# Patient Record
Sex: Female | Born: 1990 | Race: White | Hispanic: No | Marital: Single | Smoking: Former smoker
Health system: Southern US, Community
[De-identification: ages and names within clinical notes are randomized; demographics above are authoritative.]

## PROBLEM LIST (undated history)

## (undated) ENCOUNTER — Inpatient Hospital Stay: Payer: Self-pay

## (undated) HISTORY — PX: TONSILLECTOMY: SUR1361

---

## 2010-06-03 ENCOUNTER — Emergency Department: Payer: Self-pay | Admitting: Emergency Medicine

## 2010-12-19 ENCOUNTER — Emergency Department: Payer: Self-pay | Admitting: Emergency Medicine

## 2013-01-06 ENCOUNTER — Ambulatory Visit: Payer: Self-pay | Admitting: Family Medicine

## 2013-03-27 ENCOUNTER — Observation Stay: Payer: Self-pay | Admitting: Obstetrics and Gynecology

## 2013-03-27 LAB — URINALYSIS, COMPLETE
Bilirubin,UR: NEGATIVE
Blood: NEGATIVE
Protein: NEGATIVE
RBC,UR: <1 /HPF (ref 0–5)
WBC UR: 8 /HPF (ref 0–5)

## 2013-03-27 LAB — GC/CHLAMYDIA PROBE AMP

## 2013-05-19 ENCOUNTER — Observation Stay: Payer: Self-pay | Admitting: Obstetrics and Gynecology

## 2013-05-19 LAB — DRUG SCREEN, URINE
Amphetamines, Ur Screen: NEGATIVE (ref ?–1000)
Benzodiazepine, Ur Scrn: NEGATIVE (ref ?–200)
MDMA (Ecstasy)Ur Screen: NEGATIVE (ref ?–500)
Methadone, Ur Screen: NEGATIVE (ref ?–300)
Opiate, Ur Screen: NEGATIVE (ref ?–300)
Phencyclidine (PCP) Ur S: NEGATIVE (ref ?–25)
Tricyclic, Ur Screen: NEGATIVE (ref ?–1000)

## 2013-05-20 ENCOUNTER — Inpatient Hospital Stay: Payer: Self-pay | Admitting: Obstetrics and Gynecology

## 2013-05-20 LAB — CBC WITH DIFFERENTIAL/PLATELET
Basophil %: 0.4 %
Eosinophil #: 0.2 10*3/uL (ref 0.0–0.7)
Eosinophil %: 1.2 %
HGB: 11.8 g/dL — ABNORMAL LOW (ref 12.0–16.0)
Lymphocyte #: 3.3 10*3/uL (ref 1.0–3.6)
MCH: 32.3 pg (ref 26.0–34.0)
MCHC: 34.7 g/dL (ref 32.0–36.0)
Monocyte #: 1.1 x10 3/mm — ABNORMAL HIGH (ref 0.2–0.9)
Monocyte %: 8.3 %
Neutrophil #: 8.4 10*3/uL — ABNORMAL HIGH (ref 1.4–6.5)
Neutrophil %: 64.7 %
Platelet: 207 10*3/uL (ref 150–440)

## 2013-05-22 LAB — GC/CHLAMYDIA PROBE AMP

## 2014-09-17 ENCOUNTER — Emergency Department: Payer: Self-pay | Admitting: Internal Medicine

## 2015-01-30 NOTE — H&P (Signed)
L&D Evaluation:  History Expanded:  HPI 24 yo G3P2002 at 32 weeks whohas been having cramping today and cam et o the ER. she has had a reasonably noneventful pregnancy with GC early in the pregnancy with TOC neg, late onset PNC, O pos RI, VZI,   Gravida 3   Term 2   PreTerm 0   Abortion 0   Living 2   Blood Type (Maternal) O positive   Group B Strep Results Maternal (Result >5wks must be treated as unknown) unknown/result > 5 weeks ago   Maternal HIV Negative   Maternal Syphilis Ab Nonreactive   Maternal Varicella Immune   Rubella Results (Maternal) immune   Maternal T-Dap Unknown   Presents with abdominal pain, contractions   Patient's Medical History No Chronic Illness   Patient's Surgical History none   Medications Pre Natal Vitamins   Allergies NKDA   Social History none   Family History Non-Contributory   ROS:  General normal   HEENT normal   CNS normal   GI normal   GU some pain with contractions   Resp normal   CV normal   Renal normal   MS normal   Exam:  Vital Signs stable   Urine Protein trace   General no apparent distress   Mental Status clear   Chest clear   Heart normal sinus rhythm   Abdomen other, gravid and tender to lower abd palpation,   Back no CVAT   Edema no edema   Reflexes 1+   Pelvic no external lesions   Mebranes Intact   FHT normal rate with no decels   FHT Description Late decelerations, 140   Ucx absent   Skin dry   Impression:  Impression UTI   Plan:  Plan UA, monitor contractions and for cervical change   Follow Up Appointment in 1 week   Electronic Signatures: Erik Obey (MD)  (Signed 06-Jul-14 21:20)  Authored: L&D Evaluation   Last Updated: 06-Jul-14 21:20 by Erik Obey (MD)

## 2015-01-30 NOTE — H&P (Signed)
L&D Evaluation:  History Expanded:  HPI 24 yo G3 P2002 at 21 w 5d who presents ion ctx, she does not have custody of her 24 yo and she is also late [facility] with dating by 51 week [place], she has been POS for marijuana this prefgnancy, has a hx of PP depression, and got tDAP on 03/01/13, she was POS for [facility] on 10/13/12, she was seen earlier asnd given therapeutic rest and now returns 5 hours later contracting and 4 cm/posterior/intact. POS for marijuana on admission this evening,   Gravida 3   Term 2   PreTerm 0   Abortion 0   Living 2   Blood Type (Maternal) O positive   Group B Strep Results Maternal (Result >5wks must be treated as unknown) negative   Maternal HIV Negative   Maternal Syphilis Ab Nonreactive   Maternal Varicella Immune   Rubella Results (Maternal) immune   Maternal T-Dap Immune   [facility] 22-May-2013   Presents with contractions, at 2-3 minutes for 4 hours   Patient's Medical History late onset PnC   Patient's Surgical History none   Medications Pre Natal Vitamins   Allergies NKDA   Social History none   Family History Non-Contributory   Current Prenatal Course Notable For unsure dating by 20 week ultrasound.   ROS:  ROS All systems were reviewed.  HEENT, CNS, GI, GU, Respiratory, CV, Renal and Musculoskeletal systems were found to be normal.   Exam:  Vital Signs stable   General no apparent distress   Mental Status clear   Chest clear   Heart normal sinus rhythm   Abdomen gravid, tender with contractions   Estimated Fetal Weight Average for gestational age   Fetal Position v   Fundal Height term   Back no CVAT   Edema no edema   Reflexes 1+   Pelvic no external lesions, 4/50 posterior   Mebranes Intact   FHT normal rate with no decels, ctx q 2-4 minutes   FHT Description CAT 1   Fetal Heart Rate 150   Ucx regular   Ucx Frequency 2 min   Length of each Contraction 40 seconds   Ucx Pain Scale 7   Skin dry   Lymph no  lymphadenopathy   Impression:  Impression active labor   Plan:  Plan EFM/NST, monitor contractions and for cervical change, fluids, pain mgmt   Comments allolw to labor GBS neg and anticipate SVD.   Follow Up Appointment need to schedule. in 6 weeks   Electronic Signatures: Erik Obey (MD)  (Signed 29-Aug-14 04:11)  Authored: L&D Evaluation   Last Updated: 29-Aug-14 04:11 by Erik Obey (MD)

## 2015-01-30 NOTE — H&P (Signed)
L&D Evaluation:  History Expanded:  HPI 24 yo G3 P2002 at 3 w 5d who presents ion ctx, she does not have custody of her 24 yo and she is also late [facility] with dating by 2 week [place], she has been POS for marijuana this prefgnancy, has a hx of PP depression, and got tDAP on 03/01/13, she was POS for [facility] on 10/13/12   Gravida 3   Term 2   PreTerm 0   Abortion 0   Living 2   Blood Type (Maternal) O positive   Group B Strep Results Maternal (Result >5wks must be treated as unknown) negative   Maternal HIV Negative   Maternal Syphilis Ab Nonreactive   Maternal Varicella Immune   Rubella Results (Maternal) immune   Maternal T-Dap Immune   [facility] 22-May-2013   Presents with contractions, at 3-4 min  and for about 2 hours   Patient's Medical History late onset PBC   Patient's Surgical History none   Medications Pre Natal Vitamins   Allergies NKDA   Social History none   Family History Non-Contributory   ROS:  ROS All systems were reviewed.  HEENT, CNS, GI, GU, Respiratory, CV, Renal and Musculoskeletal systems were found to be normal.   Exam:  Vital Signs stable   General no apparent distress   Mental Status clear   Chest clear   Heart normal sinus rhythm   Abdomen gravid, tender with contractions   Estimated Fetal Weight Average for gestational age   Fetal Position v   Fundal Height term   Back no CVAT   Edema no edema   Reflexes 1+   Pelvic no external lesions, 1cm/ thick   Mebranes Intact   FHT normal rate with no decels, ctx q 2-4 minutes   Fetal Heart Rate 150   Ucx regular   Skin dry   Lymph no lymphadenopathy   Impression:  Impression early labor, early cotx,   Plan:  Plan EFM/NST, monitor contractions and for cervical change, uds   Comments will do serial cervical checks and allow to walk. if she gets to 4 cm she will be admitted otherwise latent pabor and she will be offered a trial of morphine and therapeutic rest.   Follow Up  Appointment need to schedule   Electronic Signatures: Erik Obey (MD)  (Signed 28-Aug-14 21:57)  Authored: L&D Evaluation   Last Updated: 28-Aug-14 21:57 by Erik Obey (MD)

## 2015-05-20 ENCOUNTER — Emergency Department: Payer: Medicaid Other

## 2015-05-20 ENCOUNTER — Encounter: Payer: Self-pay | Admitting: Emergency Medicine

## 2015-05-20 ENCOUNTER — Emergency Department
Admission: EM | Admit: 2015-05-20 | Discharge: 2015-05-20 | Disposition: A | Discharge status: Home / Self Care | Payer: Medicaid Other | Attending: Emergency Medicine | Admitting: Emergency Medicine

## 2015-05-20 DIAGNOSIS — Y990 Civilian activity done for income or pay: Secondary | ICD-10-CM | POA: Diagnosis not present

## 2015-05-20 DIAGNOSIS — S60212A Contusion of left wrist, initial encounter: Secondary | ICD-10-CM | POA: Diagnosis not present

## 2015-05-20 DIAGNOSIS — S60222A Contusion of left hand, initial encounter: Secondary | ICD-10-CM | POA: Diagnosis not present

## 2015-05-20 DIAGNOSIS — Y9389 Activity, other specified: Secondary | ICD-10-CM | POA: Diagnosis not present

## 2015-05-20 DIAGNOSIS — W228XXA Striking against or struck by other objects, initial encounter: Secondary | ICD-10-CM | POA: Insufficient documentation

## 2015-05-20 DIAGNOSIS — Y9289 Other specified places as the place of occurrence of the external cause: Secondary | ICD-10-CM | POA: Insufficient documentation

## 2015-05-20 DIAGNOSIS — Z720 Tobacco use: Secondary | ICD-10-CM | POA: Insufficient documentation

## 2015-05-20 DIAGNOSIS — S6992XA Unspecified injury of left wrist, hand and finger(s), initial encounter: Secondary | ICD-10-CM | POA: Diagnosis present

## 2015-05-20 MED ORDER — IBUPROFEN 800 MG PO TABS: 800.0000 mg | ORAL_TABLET | Freq: Three times a day (TID) | ORAL | Status: DC

## 2015-05-20 MED ORDER — HYDROCODONE-ACETAMINOPHEN 5-325 MG PO TABS: 1.0000 | ORAL_TABLET | ORAL | Status: DC | PRN

## 2015-05-20 MED ORDER — HYDROCODONE-ACETAMINOPHEN 5-325 MG PO TABS
1.0000 | ORAL_TABLET | Freq: Once | ORAL | Status: AC
  Administered 2015-05-20: 1 via ORAL
  Filled 2015-05-20: qty 1

## 2015-05-20 NOTE — ED Notes (Signed)
Pt. ambulatory into triage without difficulty.  Pt. States while at work hurt her lt. Wrist and hand on swinging door.  Pt. Lt. Wrist was bent backwards.  No obvious deformity to lt. Wrist.  Pt. States painful to move 4th and 5th finger.  Pt. Has swelling to lt. Wrist.

## 2015-05-20 NOTE — ED Notes (Signed)
AAOx3.  Skin warm and dry.  NAD.  D/C home with family.

## 2015-05-20 NOTE — ED Provider Notes (Signed)
[facility] Emergency Department Provider Note  ____________________________________________  Time seen: Approximately 9:00 PM  I have reviewed the triage vital signs and the nursing notes.   HISTORY  Chief Complaint Wrist Pain   HPI Melanie York is a 24 y.o. female is here with complaint of left hand and wrist hurting. She states this happened at work when a door swung into her and made her wrist bent backwards. She states it is painful to move her fourth and fifth fingers. She has not taken any over-the-counter medication for pain prior to coming to the emergency room. She denies any previous injury to her wrist. Currently she has ice on her wrist. She still states that it still continues to be an 8 out of 10.   History reviewed. No pertinent past medical history.  There are no active problems to display for this patient.   Past Surgical History  Procedure Laterality Date  . Tonsillectomy      Current Outpatient Rx  Name  Route  Sig  Dispense  Refill  . HYDROcodone-acetaminophen (NORCO/VICODIN) 5-325 MG per tablet   Oral   Take 1 tablet by mouth every 4 (four) hours as needed for moderate pain.   20 tablet   0   . ibuprofen (ADVIL,MOTRIN) 800 MG tablet   Oral   Take 1 tablet (800 mg total) by mouth 3 (three) times daily.   30 tablet   0     Allergies Review of patient's allergies indicates no known allergies.  Family History  Problem Relation Age of Onset  . Diabetes Mother   . Cancer Mother     Social History Social History  Substance Use Topics  . Smoking status: Current Every Day Smoker -- 0.25 packs/day  . Smokeless tobacco: None  . Alcohol Use: No    Review of Systems Constitutional: No fever/chills .Cardiovascular: Denies chest pain. Respiratory: Denies shortness of breath. Gastrointestinal: .  No nausea, no vomiting.  Musculoskeletal: Negative for back pain. Skin: Negative for rash. Neurological: Negative for  headaches, focal weakness or numbness.  10-point ROS otherwise negative.  ____________________________________________   PHYSICAL EXAM:  VITAL SIGNS: ED Triage Vitals  Enc Vitals Group     BP 05/20/15 2006 112/68 mmHg     Pulse Rate 05/20/15 2006 86     Resp 05/20/15 2006 18     Temp 05/20/15 2006 98.2 F (36.8 C)     Temp Source 05/20/15 2006 Oral     SpO2 05/20/15 2006 98 %     Weight 05/20/15 2006 120 lb (54.432 kg)     Height 05/20/15 2006 5\' 4"  (1.626 m)     Head Cir --      Peak Flow --      Pain Score 05/20/15 2007 8     Pain Loc --      Pain Edu? --      Excl. in [place]? --     Constitutional: Alert and oriented. Well appearing and in no acute distress. Eyes: Conjunctivae are normal. PERRL. EOMI. Head: Atraumatic. Nose: No congestion/rhinnorhea. Neck: No stridor.   Cardiovascular: Normal rate, regular rhythm. Grossly normal heart sounds.  Good peripheral circulation. Respiratory: Normal respiratory effort.  No retractions. Lungs CTAB. Musculoskeletal: Left hand and wrist there is no gross deformity. There is no edema noted on the lateral aspect of patient's hand. Movement of the fourth and fifth fingers increases patient's pain. Motor sensory function intact. Flexion and extension of her wrist increases pain  as well. Pulses positive No lower extremity tenderness nor edema.  No joint effusions. Neurologic:  Normal speech and language. No gross focal neurologic deficits are appreciated. No gait instability. Skin:  Skin is warm, dry and intact. No rash noted. No abrasions or ecchymosis is noted on the left hand. Psychiatric: Mood and affect are normal. Speech and behavior are normal.  ____________________________________________   LABS (all labs ordered are listed, but only abnormal results are displayed)  Labs Reviewed - No data to display   RADIOLOGY  X-ray of the left respiratory radiologist no fracture or  dislocation. ____________________________________________   PROCEDURES  Procedure(s) performed: None  Critical Care performed: No  ____________________________________________   INITIAL IMPRESSION / ASSESSMENT AND PLAN / ED COURSE  Pertinent labs & imaging results that were available during my care of the patient were reviewed by me and considered in my medical decision making (see chart for details).  Patient was placed in an OCL splint and was wearing a sling. She is to ice and elevate the extremity for swelling. She is given a prescription for Norco and ibuprofen as needed for pain. No was sent for workman's comp purposes. ____________________________________________   FINAL CLINICAL IMPRESSION(S) / ED DIAGNOSES  Final diagnoses:  Contusion, wrist, left, initial encounter  Contusion of left hand, initial encounter      Johnn Hai, PA-C 05/20/15 2125  Earleen Newport, MD 05/20/15 2134

## 2015-05-20 NOTE — ED Notes (Signed)
Spoke with Maggie Font, Owner of [employer] at [phone] who states no workers comp drug screening required.

## 2015-05-20 NOTE — ED Notes (Signed)
AAOx3.  Skin warm and dry.  NAD 

## 2015-05-20 NOTE — Discharge Instructions (Signed)
Contusion °A contusion is a deep bruise. Contusions happen when an injury causes bleeding under the skin. Signs of bruising include pain, puffiness (swelling), and discolored skin. The contusion may turn blue, purple, or yellow. °HOME CARE  °· Put ice on the injured area. °· Put ice in a plastic bag. °· Place a towel between your skin and the bag. °· Leave the ice on for 15-20 minutes, 03-04 times a day. °· Only take medicine as told by your doctor. °· Rest the injured area. °· If possible, raise (elevate) the injured area to lessen puffiness. °GET HELP RIGHT AWAY IF:  °· You have more bruising or puffiness. °· You have pain that is getting worse. °· Your puffiness or pain is not helped by medicine. °MAKE SURE YOU:  °· Understand these instructions. °· Will watch your condition. °· Will get help right away if you are not doing well or get worse. °Document Released: 02/25/2008 Document Revised: 12/01/2011 Document Reviewed: 07/14/2011 °Exit[employer]® Patient Information ©2015 [employer]. This information is not intended to replace advice given to you by your health care provider. Make sure you discuss any questions you have with your health care provider. ° °Cryotherapy °Cryotherapy is when you put ice on your injury. Ice helps lessen pain and puffiness (swelling) after an injury. Ice works the best when you start using it in the first 24 to 48 hours after an injury. °HOME CARE °· Put a dry or damp towel between the ice pack and your skin. °· You may press gently on the ice pack. °· Leave the ice on for no more than 10 to 20 minutes at a time. °· Check your skin after 5 minutes to make sure your skin is okay. °· Rest at least 20 minutes between ice pack uses. °· Stop using ice when your skin loses feeling (numbness). °· Do not use ice on someone who cannot tell you when it hurts. This includes small children and people with memory problems (dementia). °GET HELP RIGHT AWAY IF: °· You have white spots on your  skin. °· Your skin turns blue or pale. °· Your skin feels waxy or hard. °· Your puffiness gets worse. °MAKE SURE YOU:  °· Understand these instructions. °· Will watch your condition. °· Will get help right away if you are not doing well or get worse. °Document Released: 02/25/2008 Document Revised: 12/01/2011 Document Reviewed: 05/01/2011 °ExitCare® Patient Information ©2015 [employer]. This information is not intended to replace advice given to you by your health care provider. Make sure you discuss any questions you have with your health care provider. ° °

## 2016-06-13 ENCOUNTER — Emergency Department
Admission: EM | Admit: 2016-06-13 | Discharge: 2016-06-13 | Disposition: A | Discharge status: Home / Self Care | Payer: Medicaid Other | Attending: Emergency Medicine | Admitting: Emergency Medicine

## 2016-06-13 ENCOUNTER — Encounter: Payer: Self-pay | Admitting: Emergency Medicine

## 2016-06-13 DIAGNOSIS — F17200 Nicotine dependence, unspecified, uncomplicated: Secondary | ICD-10-CM | POA: Diagnosis not present

## 2016-06-13 DIAGNOSIS — T148XXA Other injury of unspecified body region, initial encounter: Secondary | ICD-10-CM

## 2016-06-13 DIAGNOSIS — Y9389 Activity, other specified: Secondary | ICD-10-CM | POA: Diagnosis not present

## 2016-06-13 DIAGNOSIS — S5011XA Contusion of right forearm, initial encounter: Secondary | ICD-10-CM | POA: Insufficient documentation

## 2016-06-13 DIAGNOSIS — Y92410 Unspecified street and highway as the place of occurrence of the external cause: Secondary | ICD-10-CM | POA: Insufficient documentation

## 2016-06-13 DIAGNOSIS — R51 Headache: Secondary | ICD-10-CM | POA: Insufficient documentation

## 2016-06-13 DIAGNOSIS — Y999 Unspecified external cause status: Secondary | ICD-10-CM | POA: Diagnosis not present

## 2016-06-13 DIAGNOSIS — Z791 Long term (current) use of non-steroidal anti-inflammatories (NSAID): Secondary | ICD-10-CM | POA: Diagnosis not present

## 2016-06-13 DIAGNOSIS — R519 Headache, unspecified: Secondary | ICD-10-CM

## 2016-06-13 DIAGNOSIS — S59911A Unspecified injury of right forearm, initial encounter: Secondary | ICD-10-CM | POA: Diagnosis present

## 2016-06-13 MED ORDER — PROCHLORPERAZINE EDISYLATE 5 MG/ML IJ SOLN
10.0000 mg | Freq: Once | INTRAMUSCULAR | Status: AC
  Administered 2016-06-13: 10 mg via INTRAVENOUS
  Filled 2016-06-13: qty 2

## 2016-06-13 MED ORDER — SODIUM CHLORIDE 0.9 % IV BOLUS (SEPSIS)
1000.0000 mL | Freq: Once | INTRAVENOUS | Status: AC
  Administered 2016-06-13: 1000 mL via INTRAVENOUS

## 2016-06-13 MED ORDER — PROCHLORPERAZINE EDISYLATE 5 MG/ML IJ SOLN
INTRAMUSCULAR | Status: AC
  Filled 2016-06-13: qty 2

## 2016-06-13 NOTE — ED Notes (Signed)
POCT URINE PREGNANCY RESULT      NEGATIVE
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
## 2016-06-13 NOTE — Discharge Instructions (Signed)
Please seek medical attention for any high fevers, chest pain, shortness of breath, change in behavior, persistent vomiting, bloody stool or any other new or concerning symptoms.  
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
## 2016-06-13 NOTE — ED Notes (Signed)
Pt. Verbalizes understanding of d/c instructions, and follow-up. VS stable and pain controlled per pt.  Pt. In NAD at time of d/c and denies further concerns regarding this visit. Pt. Stable at the time of departure from the unit, departing unit by the safest and most appropriate manner per that pt condition and limitations. Pt advised to return to the ED at any time for emergent concerns, or for new/worsening symptoms.   

## 2016-09-07 ENCOUNTER — Emergency Department: Payer: Medicaid Other

## 2016-09-07 ENCOUNTER — Emergency Department
Admission: EM | Admit: 2016-09-07 | Discharge: 2016-09-07 | Disposition: A | Discharge status: Home / Self Care | Payer: Medicaid Other | Attending: Emergency Medicine | Admitting: Emergency Medicine

## 2016-09-07 DIAGNOSIS — W208XXA Other cause of strike by thrown, projected or falling object, initial encounter: Secondary | ICD-10-CM | POA: Insufficient documentation

## 2016-09-07 DIAGNOSIS — Y999 Unspecified external cause status: Secondary | ICD-10-CM | POA: Insufficient documentation

## 2016-09-07 DIAGNOSIS — S92355A Nondisplaced fracture of fifth metatarsal bone, left foot, initial encounter for closed fracture: Secondary | ICD-10-CM

## 2016-09-07 DIAGNOSIS — Y929 Unspecified place or not applicable: Secondary | ICD-10-CM | POA: Insufficient documentation

## 2016-09-07 DIAGNOSIS — F17200 Nicotine dependence, unspecified, uncomplicated: Secondary | ICD-10-CM | POA: Diagnosis not present

## 2016-09-07 DIAGNOSIS — S99922A Unspecified injury of left foot, initial encounter: Secondary | ICD-10-CM | POA: Diagnosis present

## 2016-09-07 DIAGNOSIS — Y9389 Activity, other specified: Secondary | ICD-10-CM | POA: Insufficient documentation

## 2016-09-07 MED ORDER — HYDROCODONE-ACETAMINOPHEN 5-325 MG PO TABS: 1.0000 | ORAL_TABLET | ORAL | 0 refills | Status: DC | PRN

## 2016-09-07 MED ORDER — NAPROXEN 500 MG PO TABS: 500.0000 mg | ORAL_TABLET | Freq: Two times a day (BID) | ORAL | 2 refills | Status: DC
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
## 2016-09-22 NOTE — L&D Delivery Note (Signed)
0330 In room to see pt, SVE: 10/100/+2, vertex. Effective maternal pushing efforts noted.   Spontaneous vaginal birth of liveborn female infant over intact perineum in vertex presentation, LOA position at 0343. Loose nuchal cord x 1 reduced on perineum without difficulty. Infant immediately to maternal abdomen. Delayed cord clamping. Three (3) vessel cord, cord blood collected. APGARS: 8, 9. Weight: 3280 grams (7 pounds, 4 ounces). Receiving nurse present at bedside for birth.   Pitocin infusing. Spontaneous delivery of placenta at 0348. Epidural anesthesia. EBL: 200 ml. 800 mcg cytotec placed rectally due to multiparity and history of PPH with previous births. Vault check completed. Counts correct x 2.   Mom to postpartum.  Baby to Couplet care / Skin to Skin. Initiate routine postpartum care and orders.    Diona Fanti, CNM 09/20/2017, 4:02 AM

## 2016-12-16 ENCOUNTER — Ambulatory Visit: Payer: Self-pay | Admitting: Obstetrics and Gynecology

## 2017-02-04 ENCOUNTER — Encounter: Payer: Self-pay | Admitting: Certified Nurse Midwife

## 2017-02-04 ENCOUNTER — Ambulatory Visit (INDEPENDENT_AMBULATORY_CARE_PROVIDER_SITE_OTHER): Payer: Medicaid Other | Admitting: Certified Nurse Midwife

## 2017-02-04 ENCOUNTER — Ambulatory Visit (INDEPENDENT_AMBULATORY_CARE_PROVIDER_SITE_OTHER): Payer: Medicaid Other

## 2017-02-04 VITALS — BP 98/56 | HR 62 | Ht 64.0 in | Wt 132.6 lb

## 2017-02-04 DIAGNOSIS — Z3687 Encounter for antenatal screening for uncertain dates: Secondary | ICD-10-CM

## 2017-02-04 DIAGNOSIS — N926 Irregular menstruation, unspecified: Secondary | ICD-10-CM

## 2017-02-04 LAB — POCT URINE PREGNANCY: PREG TEST UR: POSITIVE — AB

## 2017-02-04 MED ORDER — CONCEPT DHA 53.5-38-1 MG PO CAPS: 1.0000 | ORAL_CAPSULE | Freq: Every day | ORAL | 11 refills | Status: DC

## 2017-02-04 NOTE — Patient Instructions (Signed)

## 2017-02-04 NOTE — Progress Notes (Signed)
Patient ID: Melanie York, female   DOB: 03-03-1991, 26 y.o.   MRN: [id number] Pt presents for confirmation of pregnancy. UPT: POSITIVE. LMP: 12/05/2016. Approx.,/irregular periods. EGA: 8.5 wks.  EDD: 09/11/2017. Ultrasound ordered for unsure of lmp and h/o irreg menses. Had Nexplanon removed in February 2018.

## 2017-02-04 NOTE — Progress Notes (Signed)
Subjective:    Melanie York is a 26 y.o. female who presents for evaluation of amenorrhea. She believes she could be pregnant. Pregnancy is desired. Sexual Activity: single partner, contraception: none. Current symptoms also include: nausea. Last period was normal.   Patient's last menstrual period was 12/05/2016 (approximate). The following portions of the patient's history were reviewed and updated as appropriate: allergies, current medications, past family history, past medical history, past social history, past surgical history and problem list.  Review of Systems Pertinent items are noted in HPI.     Objective:    BP (!) 98/56   Pulse 62   Ht 5\' 4"  (1.626 m)   Wt 132 lb 9.6 oz (60.1 kg)   LMP 12/05/2016 (Approximate)   BMI 22.76 kg/m  General: alert, cooperative, appears stated age, no distress and no acute distress    Lab Review Urine HCG: positive    Assessment:    Absence of menstruation.     Plan:    Pregnancy Test: The patient has been given an overview regarding routine prenatal care. Recommendations regarding diet, weight gain, medications, and exercise in pregnancy were given. Discussed smoking cessation. Pamphlet given.  Prenatal testing, optional genetic testing, and ultrasound use in pregnancy were reviewed. Samples of prenatal vitamins given. U/S for dating asap. Will follow up for NOB nurse visit @ 10 wks  And NOB physical exam @ 12 wks.   Philip Aspen, CNM

## 2017-02-09 ENCOUNTER — Ambulatory Visit: Payer: Medicaid Other | Admitting: Certified Nurse Midwife

## 2017-02-09 VITALS — BP 93/52 | HR 55 | Ht 64.0 in | Wt 132.2 lb

## 2017-02-09 DIAGNOSIS — Z3480 Encounter for supervision of other normal pregnancy, unspecified trimester: Secondary | ICD-10-CM

## 2017-02-09 NOTE — Progress Notes (Signed)
Melanie York presents for NOB nurse interview visit. Pregnancy confirmation done __5/16/18____.  G-4 .  P-3 . Pregnancy education material explained and given. _0__ cats in the home. NOB labs ordered. HIV labs and Drug screen were explained optional and she did not decline. Drug screen ordered. PNV encouraged. Genetic screening options discussed. Genetic testing: Ordered/Declined/Unsure.  Pt may discuss with provider. Pt. To follow up with provider in _4_ weeks for NOB physical.  All questions answered.

## 2017-02-19 ENCOUNTER — Emergency Department
Admission: EM | Admit: 2017-02-19 | Discharge: 2017-02-19 | Disposition: A | Discharge status: Home / Self Care | Payer: Medicaid Other | Attending: Emergency Medicine | Admitting: Emergency Medicine

## 2017-02-19 ENCOUNTER — Emergency Department: Payer: Medicaid Other

## 2017-02-19 DIAGNOSIS — Z3A09 9 weeks gestation of pregnancy: Secondary | ICD-10-CM | POA: Insufficient documentation

## 2017-02-19 DIAGNOSIS — R42 Dizziness and giddiness: Secondary | ICD-10-CM

## 2017-02-19 DIAGNOSIS — R102 Pelvic and perineal pain: Secondary | ICD-10-CM | POA: Insufficient documentation

## 2017-02-19 DIAGNOSIS — O26891 Other specified pregnancy related conditions, first trimester: Secondary | ICD-10-CM | POA: Insufficient documentation

## 2017-02-19 DIAGNOSIS — R1031 Right lower quadrant pain: Secondary | ICD-10-CM | POA: Insufficient documentation

## 2017-02-19 DIAGNOSIS — O26899 Other specified pregnancy related conditions, unspecified trimester: Secondary | ICD-10-CM

## 2017-02-19 DIAGNOSIS — Z87891 Personal history of nicotine dependence: Secondary | ICD-10-CM | POA: Insufficient documentation

## 2017-02-19 DIAGNOSIS — R1030 Lower abdominal pain, unspecified: Secondary | ICD-10-CM

## 2017-02-19 LAB — CBC
HCT: 38.3 % (ref 35.0–47.0)
HEMOGLOBIN: 13.3 g/dL (ref 12.0–16.0)
MCH: 32.5 pg (ref 26.0–34.0)
MCHC: 34.7 g/dL (ref 32.0–36.0)
MCV: 93.6 fL (ref 80.0–100.0)
Platelets: 196 10*3/uL (ref 150–440)
RBC: 4.1 MIL/uL (ref 3.80–5.20)
RDW: 12.3 % (ref 11.5–14.5)
WBC: 6.9 10*3/uL (ref 3.6–11.0)

## 2017-02-19 LAB — URINALYSIS, COMPLETE (UACMP) WITH MICROSCOPIC
BACTERIA UA: NONE SEEN
Bilirubin Urine: NEGATIVE
Glucose, UA: NEGATIVE mg/dL
Hgb urine dipstick: NEGATIVE
Ketones, ur: 5 mg/dL — AB
Leukocytes, UA: NEGATIVE
Nitrite: NEGATIVE
PROTEIN: NEGATIVE mg/dL
Specific Gravity, Urine: 1.012 (ref 1.005–1.030)
pH: 6 (ref 5.0–8.0)

## 2017-02-19 LAB — BASIC METABOLIC PANEL
ANION GAP: 6 (ref 5–15)
BUN: 9 mg/dL (ref 6–20)
CO2: 24 mmol/L (ref 22–32)
Calcium: 9 mg/dL (ref 8.9–10.3)
Chloride: 103 mmol/L (ref 101–111)
Creatinine, Ser: 0.59 mg/dL (ref 0.44–1.00)
GFR calc Af Amer: >60 mL/min (ref >60)
GFR calc non Af Amer: >60 mL/min (ref >60)
GLUCOSE: 94 mg/dL (ref 65–99)
POTASSIUM: 3.9 mmol/L (ref 3.5–5.1)
Sodium: 133 mmol/L — ABNORMAL LOW (ref 135–145)

## 2017-02-19 LAB — HCG, QUANTITATIVE, PREGNANCY: hCG, Beta Chain, Quant, S: 175336 m[IU]/mL — ABNORMAL HIGH (ref <5)

## 2017-02-19 MED ORDER — SODIUM CHLORIDE 0.9 % IV SOLN
Freq: Once | INTRAVENOUS | Status: AC
  Administered 2017-02-19: 18:00:00 via INTRAVENOUS

## 2017-02-19 NOTE — ED Provider Notes (Signed)
[facility] Emergency Department Provider Note       Time seen: ----------------------------------------- 5:56 PM on 02/19/2017 -----------------------------------------     I have reviewed the triage vital signs and the nursing notes.   HISTORY   Chief Complaint Dizziness and Abdominal Pain    HPI Melanie York is a 26 y.o. female who presents to the ED for dizziness and headache with lower abdominal pain. Pain seems to be in the right lower quadrant. Patient states she's not weeks pregnant and has had cramping in the right hemipelvis. She denies fevers, chills, vomiting or diarrhea. She has not had any morning sickness. Nothing makes her symptoms better or worse.   History reviewed. No pertinent past medical history.  There are no active problems to display for this patient.   Past Surgical History:  Procedure Laterality Date  . TONSILLECTOMY      Allergies Naproxen  Social History Social History  Substance Use Topics  . Smoking status: Former Smoker    Packs/day: 0.25  . Smokeless tobacco: Never Used  . Alcohol use Yes     Comment: occ. before pregnancy    Review of Systems Constitutional: Negative for fever. Eyes: Negative for vision changes ENT:  Negative for congestion, sore throat Cardiovascular: Negative for chest pain. Respiratory: Negative for shortness of breath. Gastrointestinal: Positive for abdominal pain Genitourinary: Negative for dysuria. Musculoskeletal: Negative for back pain. Skin: Negative for rash. Neurological: Negative for headaches, focal weakness or numbness.Positive for dizziness  All systems negative/normal/unremarkable except as stated in the HPI  ____________________________________________   PHYSICAL EXAM:  VITAL SIGNS: ED Triage Vitals  Enc Vitals Group     BP 02/19/17 1652 99/61     Pulse Rate 02/19/17 1652 83     Resp 02/19/17 1652 18     Temp 02/19/17 1652 97.9 F (36.6 C)     Temp  Source 02/19/17 1652 Oral     SpO2 02/19/17 1652 99 %     Weight 02/19/17 1653 132 lb (59.9 kg)     Height 02/19/17 1653 5\' 4"  (1.626 m)     Head Circumference --      Peak Flow --      Pain Score 02/19/17 1652 8     Pain Loc --      Pain Edu? --      Excl. in [place]? --     Constitutional: Alert and oriented. Well appearing and in no distress. Eyes: Conjunctivae are normal. Normal extraocular movements. ENT   Head: Normocephalic and atraumatic.   Nose: No congestion/rhinnorhea.   Mouth/Throat: Mucous membranes are moist.   Neck: No stridor. Cardiovascular: Normal rate, regular rhythm. No murmurs, rubs, or gallops. Respiratory: Normal respiratory effort without tachypnea nor retractions. Breath sounds are clear and equal bilaterally. No wheezes/rales/rhonchi. Gastrointestinal: Right lower quadrant abdominal tenderness, no rebound or guarding. Normal bowel sounds. Musculoskeletal: Nontender with normal range of motion in extremities. No lower extremity tenderness nor edema. Neurologic:  Normal speech and language. No gross focal neurologic deficits are appreciated.  Skin:  Skin is warm, dry and intact. No rash noted. ___________________________________________  ED COURSE:  Pertinent labs & imaging results that were available during my care of the patient were reviewed by me and considered in my medical decision making (see chart for details). Patient presents for dizziness and abdominal pain and pregnancy, we will assess with labs and imaging as indicated.   Procedures ____________________________________________   LABS (pertinent positives/negatives)  Labs Reviewed  BASIC METABOLIC  PANEL - Abnormal; Notable for the following:       Result Value   Sodium 133 (*)    All other components within normal limits  URINALYSIS, COMPLETE (UACMP) WITH MICROSCOPIC - Abnormal; Notable for the following:    Color, Urine YELLOW (*)    APPearance CLEAR (*)    Ketones, ur 5 (*)     Squamous Epithelial / LPF 0-5 (*)    All other components within normal limits  HCG, QUANTITATIVE, PREGNANCY - Abnormal; Notable for the following:    hCG, Beta Chain, Quant, S 175,336 (*)    All other components within normal limits  CBC  CBG MONITORING, ED   Pregnancy Ultrasound IMPRESSION: 1.  Single living IUP demonstrated. 2. Small volume subchorionic hemorrhage. Small volume simple appearing pelvic free fluid. 3. No evidence of ovarian torsion. Corpus luteum cyst suspected on the right.  ____________________________________________  FINAL ASSESSMENT AND PLAN  Dizziness, abdominal pain and pregnancy  Plan: Patient's labs and imaging were dictated above. Patient had presented for Dizziness and abdominal pain and pregnancy. Ultrasound was reassuring. She is stable for outpatient follow-up with her OB doctor.    Earleen Newport, MD   Note: This note was generated in part or whole with voice recognition software. Voice recognition is usually quite accurate but there are transcription errors that can and very often do occur. I apologize for any typographical errors that were not detected and corrected.     Earleen Newport, MD 02/19/17 [phone]

## 2017-02-19 NOTE — ED Triage Notes (Addendum)
Pt comes from home with c/o dizziness with headache and lower abdominal pain. Pt denies LOC. Pt states she is [redacted] weeks pregnant. Pt denies N/V. Pt in NAD at this time. Respirations even and unlabored.

## 2017-02-20 ENCOUNTER — Encounter: Payer: Medicaid Other | Admitting: Certified Nurse Midwife

## 2017-02-23 ENCOUNTER — Other Ambulatory Visit: Payer: Medicaid Other

## 2017-02-23 DIAGNOSIS — Z3480 Encounter for supervision of other normal pregnancy, unspecified trimester: Secondary | ICD-10-CM

## 2017-02-23 LAB — OB RESULTS CONSOLE VARICELLA ZOSTER ANTIBODY, IGG: Varicella: IMMUNE

## 2017-02-24 LAB — CBC WITH DIFFERENTIAL
BASOS: 0 %
Basophils Absolute: 0 10*3/uL (ref 0.0–0.2)
EOS (ABSOLUTE): 0.2 10*3/uL (ref 0.0–0.4)
EOS: 3 %
HEMATOCRIT: 40.5 % (ref 34.0–46.6)
Hemoglobin: 13.5 g/dL (ref 11.1–15.9)
IMMATURE GRANS (ABS): 0 10*3/uL (ref 0.0–0.1)
Immature Granulocytes: 0 %
LYMPHS: 39 %
Lymphocytes Absolute: 1.7 10*3/uL (ref 0.7–3.1)
MCH: 31.7 pg (ref 26.6–33.0)
MCHC: 33.3 g/dL (ref 31.5–35.7)
MCV: 95 fL (ref 79–97)
MONOS ABS: 0.3 10*3/uL (ref 0.1–0.9)
Monocytes: 7 %
NEUTROS ABS: 2.3 10*3/uL (ref 1.4–7.0)
Neutrophils: 51 %
RBC: 4.26 x10E6/uL (ref 3.77–5.28)
RDW: 12.8 % (ref 12.3–15.4)
WBC: 4.5 10*3/uL (ref 3.4–10.8)

## 2017-02-24 LAB — ANTIBODY SCREEN: Antibody Screen: NEGATIVE

## 2017-02-24 LAB — HIV ANTIBODY (ROUTINE TESTING W REFLEX): HIV Screen 4th Generation wRfx: NONREACTIVE

## 2017-02-24 LAB — VARICELLA ZOSTER ANTIBODY, IGG: Varicella zoster IgG: 1536 index (ref >165)

## 2017-02-24 LAB — ABO AND RH: Rh Factor: POSITIVE

## 2017-02-24 LAB — RPR: RPR: NONREACTIVE

## 2017-02-24 LAB — RUBELLA SCREEN: RUBELLA: 5.82 {index} (ref >0.99)

## 2017-02-24 LAB — HEPATITIS B SURFACE ANTIGEN: Hepatitis B Surface Ag: NEGATIVE

## 2017-02-25 LAB — URINE CULTURE

## 2017-02-26 ENCOUNTER — Encounter: Payer: Medicaid Other | Admitting: Certified Nurse Midwife

## 2017-03-01 LAB — MONITOR DRUG PROFILE 14(MW)
AMPHETAMINE SCREEN URINE: NEGATIVE ng/mL
BARBITURATE SCREEN URINE: NEGATIVE ng/mL
BENZODIAZEPINE SCREEN, URINE: NEGATIVE ng/mL
Buprenorphine, Urine: NEGATIVE ng/mL
COCAINE(METAB.)SCREEN, URINE: NEGATIVE ng/mL
Creatinine(Crt), U: 144.6 mg/dL (ref 20.0–300.0)
FENTANYL, URINE: NEGATIVE pg/mL
Meperidine Screen, Urine: NEGATIVE ng/mL
Methadone Screen, Urine: NEGATIVE ng/mL
OPIATE SCREEN URINE: NEGATIVE ng/mL
OXYCODONE+OXYMORPHONE UR QL SCN: NEGATIVE ng/mL
Ph of Urine: 5.4 (ref 4.5–8.9)
Phencyclidine Qn, Ur: NEGATIVE ng/mL
Propoxyphene Scrn, Ur: NEGATIVE ng/mL
SPECIFIC GRAVITY: 1.025
TRAMADOL SCREEN, URINE: NEGATIVE ng/mL

## 2017-03-01 LAB — MATERNIT 21 PLUS CORE, BLOOD
Chromosome 13: NEGATIVE
Chromosome 18: NEGATIVE
Chromosome 21: NEGATIVE
Y Chromosome: NOT DETECTED

## 2017-03-01 LAB — URINALYSIS, ROUTINE W REFLEX MICROSCOPIC
BILIRUBIN UA: NEGATIVE
Glucose, UA: NEGATIVE
Ketones, UA: NEGATIVE
Nitrite, UA: NEGATIVE
PH UA: 5.5 (ref 5.0–7.5)
PROTEIN UA: NEGATIVE
RBC UA: NEGATIVE
Specific Gravity, UA: 1.021 (ref 1.005–1.030)
UUROB: 0.2 mg/dL (ref 0.2–1.0)

## 2017-03-01 LAB — CANNABINOID (GC/MS), URINE
CANNABINOID UR: POSITIVE — AB
CARBOXY THC UR: 58 ng/mL

## 2017-03-01 LAB — MICROSCOPIC EXAMINATION
Casts: NONE SEEN /lpf
Epithelial Cells (non renal): >10 /hpf — AB (ref 0–10)

## 2017-03-01 LAB — NICOTINE SCREEN, URINE: COTININE UR QL SCN: POSITIVE ng/mL

## 2017-03-03 ENCOUNTER — Encounter: Payer: Self-pay | Admitting: Certified Nurse Midwife

## 2017-03-09 ENCOUNTER — Ambulatory Visit (INDEPENDENT_AMBULATORY_CARE_PROVIDER_SITE_OTHER): Payer: Self-pay | Admitting: Certified Nurse Midwife

## 2017-03-09 VITALS — BP 103/57 | HR 64 | Wt 135.4 lb

## 2017-03-09 DIAGNOSIS — Z124 Encounter for screening for malignant neoplasm of cervix: Secondary | ICD-10-CM

## 2017-03-09 DIAGNOSIS — Z8751 Personal history of pre-term labor: Secondary | ICD-10-CM | POA: Insufficient documentation

## 2017-03-09 DIAGNOSIS — F1291 Cannabis use, unspecified, in remission: Secondary | ICD-10-CM | POA: Insufficient documentation

## 2017-03-09 DIAGNOSIS — O26891 Other specified pregnancy related conditions, first trimester: Secondary | ICD-10-CM

## 2017-03-09 DIAGNOSIS — O09211 Supervision of pregnancy with history of pre-term labor, first trimester: Secondary | ICD-10-CM

## 2017-03-09 DIAGNOSIS — N898 Other specified noninflammatory disorders of vagina: Secondary | ICD-10-CM

## 2017-03-09 DIAGNOSIS — Z3482 Encounter for supervision of other normal pregnancy, second trimester: Secondary | ICD-10-CM

## 2017-03-09 DIAGNOSIS — Z87898 Personal history of other specified conditions: Secondary | ICD-10-CM

## 2017-03-09 LAB — POCT URINALYSIS DIPSTICK
Bilirubin, UA: NEGATIVE
GLUCOSE UA: NEGATIVE
KETONES UA: NEGATIVE
Leukocytes, UA: NEGATIVE
Nitrite, UA: NEGATIVE
Protein, UA: NEGATIVE
SPEC GRAV UA: 1.025 (ref 1.010–1.025)
Urobilinogen, UA: 0.2 E.U./dL
pH, UA: 6 (ref 5.0–8.0)

## 2017-03-09 NOTE — Progress Notes (Addendum)
NEW OB HISTORY AND PHYSICAL  SUBJECTIVE:       Melanie York is a 26 y.o. [phone] female, Patient's last menstrual period was 12/05/2016 (exact date)., Estimated Date of Delivery: 09/20/17, [redacted]w[redacted]d, presents today for establishment of Prenatal Care.  She has no unusual complaints and reports breast tenderness and increased vaginal discharge.   History significant for preterm labor x three (3) pregnancies, vacuum assisted vaginal birth in 1683, and umbilical cord with true knot during pregnancy in 2014.  Denies difficulty breathing or respiratory distress, chest pain, abdominal pain, dysuria, vaginal bleeding and leg pain or swelling.    Gynecologic History  Patient's last menstrual period was 12/05/2016 (exact date).  Contraception: none  Last Pap: due. Results were: normal  Obstetric History OB History  Gravida Para Term Preterm AB Living  4 3 3     3   SAB TAB Ectopic Multiple Live Births          3    # Outcome Date GA Lbr Len/2nd Weight Sex Delivery Anes PTL Lv  4 Current           3 Term 05/20/13 [redacted]w[redacted]d  6 lb 1.6 oz (2.767 kg) F Vag-Spont   LIV  2 Term 09/21/09 [redacted]w[redacted]d  6 lb 2.2 oz (2.785 kg) M Vag-Spont   LIV  1 Term 11/24/07 [redacted]w[redacted]d  7 lb 1.6 oz (3.221 kg) F Vag-Spont   LIV      No past medical history on file.  Past Surgical History:  Procedure Laterality Date  . TONSILLECTOMY      Current Outpatient Prescriptions on File Prior to Visit  Medication Sig Dispense Refill  . Prenat-FeFum-FePo-FA-Omega 3 (CONCEPT DHA) 53.5-38-1 MG CAPS Take 1 capsule by mouth daily. 30 capsule 11   No current facility-administered medications on file prior to visit.     Allergies  Allergen Reactions  . Naproxen Rash    Social History   Social History  . Marital status: Single    Spouse name: N/A  . Number of children: N/A  . Years of education: N/A   Occupational History  . Not on file.   Social History Main Topics  . Smoking status: Former Smoker    Packs/day: 0.25  .  Smokeless tobacco: Never Used  . Alcohol use Yes     Comment: occ. before pregnancy  . Drug use: No  . Sexual activity: Yes    Partners: Male    Birth control/ protection: None   Other Topics Concern  . Not on file   Social History Narrative  . No narrative on file    Family History  Problem Relation Age of Onset  . Diabetes Mother   . Cancer Mother        cervical    The following portions of the patient's history were reviewed and updated as appropriate: allergies, current medications, past OB history, past medical history, past surgical history, past family history, past social history, and problem list.  OBJECTIVE:  Initial Physical Exam (New OB)  GENERAL APPEARANCE: alert, well appearing, in no apparent distress  HEAD: normocephalic, atraumatic  MOUTH: mucous membranes moist, pharynx normal without lesions and dental hygiene good  THYROID: no thyromegaly or masses present  BREASTS: no masses noted, no significant tenderness, no palpable axillary nodes, no skin changes  LUNGS: clear to auscultation, no wheezes, rales or rhonchi, symmetric air entry  HEART: regular rate and rhythm, no murmurs  ABDOMEN: soft, nontender, nondistended, no abnormal masses, no epigastric pain and  FHT present  EXTREMITIES: no redness or tenderness in the calves or thighs, no edema SKIN: normal coloration and turgor, no rashes  LYMPH NODES: no adenopathy palpable  NEUROLOGIC: alert, oriented, normal speech, no focal findings or movement disorder noted  PELVIC EXAM EXTERNAL GENITALIA: normal appearing vulva with no masses, tenderness or lesions VAGINA: clear, yellow discharge present CERVIX: no lesions or cervical motion tenderness UTERUS: gravid and consistent with 12 weeks ADNEXA: no masses palpable and nontender OB EXAM PELVIMETRY: appears adequate  ASSESSMENT:  Normal pregnancy  Vaginal discharge in pregnancy  Marijuana use  PLAN:  Prenatal care  Pap and NuSwab  collected, will contact pt with results.   [id number] completed 02/23/17: low risk, female  Discussed practice policies, schedule of prenatal care, pregnancy safe medications, and expected weight gain.   Reviewed red flag symptoms and when to call.   RTC x 4 weeks for ROB or sooner if needed.   See orders.    Diona Fanti, CNM

## 2017-03-09 NOTE — Patient Instructions (Signed)
Second Trimester of Pregnancy The second trimester is from week 14 through week 27 (months 4 through 6). The second trimester is often a time when you feel your best. Your body has adjusted to being pregnant, and you begin to feel better physically. Usually, morning sickness has lessened or quit completely, you may have more energy, and you may have an increase in appetite. The second trimester is also a time when the fetus is growing rapidly. At the end of the sixth month, the fetus is about 9 inches long and weighs about 1 pounds. You will likely begin to feel the baby move (quickening) between 16 and 20 weeks of pregnancy. Body changes during your second trimester Your body continues to go through many changes during your second trimester. The changes vary from woman to woman.  Your weight will continue to increase. You will notice your lower abdomen bulging out.  You may begin to get stretch marks on your hips, abdomen, and breasts.  You may develop headaches that can be relieved by medicines. The medicines should be approved by your health care provider.  You may urinate more often because the fetus is pressing on your bladder.  You may develop or continue to have heartburn as a result of your pregnancy.  You may develop constipation because certain hormones are causing the muscles that push waste through your intestines to slow down.  You may develop hemorrhoids or swollen, bulging veins (varicose veins).  You may have back pain. This is caused by: ? Weight gain. ? Pregnancy hormones that are relaxing the joints in your pelvis. ? A shift in weight and the muscles that support your balance.  Your breasts will continue to grow and they will continue to become tender.  Your gums may bleed and may be sensitive to brushing and flossing.  Dark spots or blotches (chloasma, mask of pregnancy) may develop on your face. This will likely fade after the baby is born.  A dark line from your  belly button to the pubic area (linea nigra) may appear. This will likely fade after the baby is born.  You may have changes in your hair. These can include thickening of your hair, rapid growth, and changes in texture. Some women also have hair loss during or after pregnancy, or hair that feels dry or thin. Your hair will most likely return to normal after your baby is born.  What to expect at prenatal visits During a routine prenatal visit:  You will be weighed to make sure you and the fetus are growing normally.  Your blood pressure will be taken.  Your abdomen will be measured to track your baby's growth.  The fetal heartbeat will be listened to.  Any test results from the previous visit will be discussed.  Your health care provider may ask you:  How you are feeling.  If you are feeling the baby move.  If you have had any abnormal symptoms, such as leaking fluid, bleeding, severe headaches, or abdominal cramping.  If you are using any tobacco products, including cigarettes, chewing tobacco, and electronic cigarettes.  If you have any questions.  Other tests that may be performed during your second trimester include:  Blood tests that check for: ? Low iron levels (anemia). ? High blood sugar that affects pregnant women (gestational diabetes) between 82 and 28 weeks. ? Rh antibodies. This is to check for a protein on red blood cells (Rh factor).  Urine tests to check for infections, diabetes, or  protein in the urine.  An ultrasound to confirm the proper growth and development of the baby.  An amniocentesis to check for possible genetic problems.  Fetal screens for spina bifida and Down syndrome.  HIV (human immunodeficiency virus) testing. Routine prenatal testing includes screening for HIV, unless you choose not to have this test.  Follow these instructions at home: Medicines  Follow your health care provider's instructions regarding medicine use. Specific  medicines may be either safe or unsafe to take during pregnancy.  Take a prenatal vitamin that contains at least 600 micrograms (mcg) of folic acid.  If you develop constipation, try taking a stool softener if your health care provider approves. Eating and drinking  Eat a balanced diet that includes fresh fruits and vegetables, whole grains, good sources of protein such as meat, eggs, or tofu, and low-fat dairy. Your health care provider will help you determine the amount of weight gain that is right for you.  Avoid raw meat and uncooked cheese. These carry germs that can cause birth defects in the baby.  If you have low calcium intake from food, talk to your health care provider about whether you should take a daily calcium supplement.  Limit foods that are high in fat and processed sugars, such as fried and sweet foods.  To prevent constipation: ? Drink enough fluid to keep your urine clear or pale yellow. ? Eat foods that are high in fiber, such as fresh fruits and vegetables, whole grains, and beans. Activity  Exercise only as directed by your health care provider. Most women can continue their usual exercise routine during pregnancy. Try to exercise for 30 minutes at least 5 days a week. Stop exercising if you experience uterine contractions.  Avoid heavy lifting, wear low heel shoes, and practice good posture.  A sexual relationship may be continued unless your health care provider directs you otherwise. Relieving pain and discomfort  Wear a good support bra to prevent discomfort from breast tenderness.  Take warm sitz baths to soothe any pain or discomfort caused by hemorrhoids. Use hemorrhoid cream if your health care provider approves.  Rest with your legs elevated if you have leg cramps or low back pain.  If you develop varicose veins, wear support hose. Elevate your feet for 15 minutes, 3-4 times a day. Limit salt in your diet. Prenatal Care  Write down your questions.  Take them to your prenatal visits.  Keep all your prenatal visits as told by your health care provider. This is important. Safety  Wear your seat belt at all times when driving.  Make a list of emergency phone numbers, including numbers for family, friends, the hospital, and police and fire departments. General instructions  Ask your health care provider for a referral to a local prenatal education class. Begin classes no later than the beginning of month 6 of your pregnancy.  Ask for help if you have counseling or nutritional needs during pregnancy. Your health care provider can offer advice or refer you to specialists for help with various needs.  Do not use hot tubs, steam rooms, or saunas.  Do not douche or use tampons or scented sanitary pads.  Do not cross your legs for long periods of time.  Avoid cat litter boxes and soil used by cats. These carry germs that can cause birth defects in the baby and possibly loss of the fetus by miscarriage or stillbirth.  Avoid all smoking, herbs, alcohol, and unprescribed drugs. Chemicals in these products can  affect the formation and growth of the baby.  Do not use any products that contain nicotine or tobacco, such as cigarettes and e-cigarettes. If you need help quitting, ask your health care provider.  Visit your dentist if you have not gone yet during your pregnancy. Use a soft toothbrush to brush your teeth and be gentle when you floss. Contact a health care provider if:  You have dizziness.  You have mild pelvic cramps, pelvic pressure, or nagging pain in the abdominal area.  You have persistent nausea, vomiting, or diarrhea.  You have a bad smelling vaginal discharge.  You have pain when you urinate. Get help right away if:  You have a fever.  You are leaking fluid from your vagina.  You have spotting or bleeding from your vagina.  You have severe abdominal cramping or pain.  You have rapid weight gain or weight  loss.  You have shortness of breath with chest pain.  You notice sudden or extreme swelling of your face, hands, ankles, feet, or legs.  You have not felt your baby move in over an hour.  You have severe headaches that do not go away when you take medicine.  You have vision changes. Summary  The second trimester is from week 14 through week 27 (months 4 through 6). It is also a time when the fetus is growing rapidly.  Your body goes through many changes during pregnancy. The changes vary from woman to woman.  Avoid all smoking, herbs, alcohol, and unprescribed drugs. These chemicals affect the formation and growth your baby.  Do not use any tobacco products, such as cigarettes, chewing tobacco, and e-cigarettes. If you need help quitting, ask your health care provider.  Contact your health care provider if you have any questions. Keep all prenatal visits as told by your health care provider. This is important. This information is not intended to replace advice given to you by your health care provider. Make sure you discuss any questions you have with your health care provider. Document Released: 09/02/2001 Document Revised: 02/14/2016 Document Reviewed: 11/09/2012 Elsevier Interactive Patient Education  2017 [employer]. Common Medications Safe in Pregnancy  Acne:      Constipation:  Benzoyl Peroxide     Colace  Clindamycin      Dulcolax Suppository  Topica Erythromycin     Fibercon  Salicylic Acid      Metamucil         Miralax AVOID:        Senakot   Accutane    Cough:  Retin-A       Cough Drops  Tetracycline      Phenergan w/ Codeine if Rx  Minocycline      Robitussin (Plain & DM)  Antibiotics:     Crabs/Lice:  Ceclor       RID  Cephalosporins    AVOID:  E-Mycins      Kwell  Keflex  Macrobid/Macrodantin   Diarrhea:  Penicillin      Kao-Pectate  Zithromax      Imodium AD         PUSH FLUIDS AVOID:       Cipro     Fever:  Tetracycline      Tylenol (Regular or  Extra  Minocycline       Strength)  Levaquin      Extra Strength-Do not          Exceed 8 tabs/24 hrs Caffeine:        <223m/day (  equiv. To 1 cup of coffee or  approx. 3 12 oz sodas)         Gas: Cold/Hayfever:       Gas-X  Benadryl      Mylicon  Claritin       Phazyme  **Claritin-D        Chlor-Trimeton    Headaches:  Dimetapp      ASA-Free Excedrin  Drixoral-Non-Drowsy     Cold Compress  Mucinex (Guaifenasin)     Tylenol (Regular or Extra  Sudafed/Sudafed-12 Hour     Strength)  **Sudafed PE Pseudoephedrine   Tylenol Cold & Sinus     Vicks Vapor Rub  Zyrtec  **AVOID if Problems With Blood Pressure         Heartburn: Avoid lying down for at least 1 hour after meals  Aciphex      Maalox     Rash:  Milk of Magnesia     Benadryl    Mylanta       1% Hydrocortisone Cream  Pepcid  Pepcid Complete   Sleep Aids:  Prevacid      Ambien   Prilosec       Benadryl  Rolaids       Chamomile Tea  Tums (Limit 4/day)     Unisom  Zantac       Tylenol PM         Warm milk-add vanilla or  Hemorrhoids:       Sugar for taste  Anusol/Anusol H.C.  (RX: Analapram 2.5%)  Sugar Substitutes:  Hydrocortisone OTC     Ok in moderation  Preparation H      Tucks        Vaseline lotion applied to tissue with wiping    Herpes:     Throat:  Acyclovir      Oragel  Famvir  Valtrex     Vaccines:         Flu Shot Leg Cramps:       *Gardasil  Benadryl      Hepatitis A         Hepatitis B Nasal Spray:       Pneumovax  Saline Nasal Spray     Polio Booster         Tetanus Nausea:       Tuberculosis test or PPD  Vitamin B6 25 mg TID   AVOID:    Dramamine      *Gardasil  Emetrol       Live Poliovirus  Ginger Root 250 mg QID    MMR (measles, mumps &  High Complex Carbs @ Bedtime    rebella)  Sea Bands-Accupressure    Varicella (Chickenpox)  Unisom 1/2 tab TID     *No known complications           If received before Pain:         Known pregnancy;   Darvocet       Resume series  after  Lortab        Delivery  Percocet    Yeast:   Tramadol      Femstat  Tylenol 3      Gyne-lotrimin  Ultram       Monistat  Vicodin           MISC:         All Sunscreens           Hair Coloring/highlights          Insect Repellant's          (  Including DEET)         Mystic Tans Morning Sickness Morning sickness is when you feel sick to your stomach (nauseous) during pregnancy. This nauseous feeling may or may not come with vomiting. It often occurs in the morning but can be a problem any time of day. Morning sickness is most common during the first trimester, but it may continue throughout pregnancy. While morning sickness is unpleasant, it is usually harmless unless you develop severe and continual vomiting (hyperemesis gravidarum). This condition requires more intense treatment. What are the causes? The cause of morning sickness is not completely known but seems to be related to normal hormonal changes that occur in pregnancy. What increases the risk? You are at greater risk if you:  Experienced nausea or vomiting before your pregnancy.  Had morning sickness during a previous pregnancy.  Are pregnant with more than one baby, such as twins.  How is this treated? Do not use any medicines (prescription, over-the-counter, or herbal) for morning sickness without first talking to your health care provider. Your health care provider may prescribe or recommend:  Vitamin B6 supplements.  Anti-nausea medicines.  The herbal medicine ginger.  Follow these instructions at home:  Only take over-the-counter or prescription medicines as directed by your health care provider.  Taking multivitamins before getting pregnant can prevent or decrease the severity of morning sickness in most women.  Eat a piece of dry toast or unsalted crackers before getting out of bed in the morning.  Eat five or six small meals a day.  Eat dry and bland foods (rice, baked potato). Foods high in  carbohydrates are often helpful.  Do not drink liquids with your meals. Drink liquids between meals.  Avoid greasy, fatty, and spicy foods.  Get someone to cook for you if the smell of any food causes nausea and vomiting.  If you feel nauseous after taking prenatal vitamins, take the vitamins at night or with a snack.  Snack on protein foods (nuts, yogurt, cheese) between meals if you are hungry.  Eat unsweetened gelatins for desserts.  Wearing an acupressure wristband (worn for sea sickness) may be helpful.  Acupuncture may be helpful.  Do not smoke.  Get a humidifier to keep the air in your house free of odors.  Get plenty of fresh air. Contact a health care provider if:  Your home remedies are not working, and you need medicine.  You feel dizzy or lightheaded.  You are losing weight. Get help right away if:  You have persistent and uncontrolled nausea and vomiting.  You pass out (faint). This information is not intended to replace advice given to you by your health care provider. Make sure you discuss any questions you have with your health care provider. Document Released: 10/30/2006 Document Revised: 02/14/2016 Document Reviewed: 02/23/2013 Elsevier Interactive Patient Education  2017 [employer].

## 2017-03-11 LAB — PAP IG W/ RFLX HPV ASCU: PAP SMEAR COMMENT: 0

## 2017-03-14 LAB — NUSWAB VAGINITIS (VG)
ATOPOBIUM VAGINAE: HIGH {score} — AB
CANDIDA ALBICANS, NAA: POSITIVE — AB
Candida glabrata, NAA: NEGATIVE
Megasphaera 1: HIGH Score — AB
Trich vag by NAA: NEGATIVE

## 2017-03-14 MED ORDER — TERCONAZOLE 0.4 % VA CREA: 1.0000 | TOPICAL_CREAM | Freq: Every day | VAGINAL | 0 refills | Status: DC

## 2017-03-14 MED ORDER — METRONIDAZOLE 500 MG PO TABS: 500.0000 mg | ORAL_TABLET | Freq: Two times a day (BID) | ORAL | 0 refills | Status: AC

## 2017-03-14 NOTE — Addendum Note (Signed)
Addended by: Cherre Huger on: 03/14/2017 02:47 PM   Modules accepted: Orders

## 2017-03-17 ENCOUNTER — Encounter: Payer: Self-pay | Admitting: Certified Nurse Midwife

## 2017-04-07 ENCOUNTER — Ambulatory Visit (INDEPENDENT_AMBULATORY_CARE_PROVIDER_SITE_OTHER): Payer: Medicaid Other | Admitting: Certified Nurse Midwife

## 2017-04-07 VITALS — BP 102/60 | HR 68 | Wt 138.0 lb

## 2017-04-07 DIAGNOSIS — Z3492 Encounter for supervision of normal pregnancy, unspecified, second trimester: Secondary | ICD-10-CM

## 2017-04-07 LAB — POCT URINALYSIS DIPSTICK
BILIRUBIN UA: NEGATIVE
Blood, UA: NEGATIVE
GLUCOSE UA: NEGATIVE
Ketones, UA: NEGATIVE
LEUKOCYTES UA: NEGATIVE
NITRITE UA: NEGATIVE
Protein, UA: NEGATIVE
Spec Grav, UA: 1.01 (ref 1.010–1.025)
Urobilinogen, UA: 0.2 E.U./dL
pH, UA: 7 (ref 5.0–8.0)

## 2017-04-07 NOTE — Progress Notes (Signed)
ROB- pt is doing well 

## 2017-04-07 NOTE — Patient Instructions (Signed)

## 2017-04-07 NOTE — Progress Notes (Signed)
ROB, doing well. No complaints . She is not sure if she is feeling movement yet. Movement audible with doppler. Ultrasound for anatomy and ROB at next visit in 4 wks.  Philip Aspen, CNM

## 2017-04-22 ENCOUNTER — Other Ambulatory Visit: Payer: Self-pay | Admitting: Certified Nurse Midwife

## 2017-04-22 DIAGNOSIS — Z369 Encounter for antenatal screening, unspecified: Secondary | ICD-10-CM

## 2017-05-06 ENCOUNTER — Ambulatory Visit (INDEPENDENT_AMBULATORY_CARE_PROVIDER_SITE_OTHER): Payer: Medicaid Other

## 2017-05-06 ENCOUNTER — Ambulatory Visit: Payer: Medicaid Other | Admitting: Obstetrics and Gynecology

## 2017-05-06 DIAGNOSIS — Z369 Encounter for antenatal screening, unspecified: Secondary | ICD-10-CM | POA: Diagnosis not present

## 2017-05-06 DIAGNOSIS — Z3482 Encounter for supervision of other normal pregnancy, second trimester: Secondary | ICD-10-CM | POA: Diagnosis not present

## 2017-06-03 ENCOUNTER — Ambulatory Visit (INDEPENDENT_AMBULATORY_CARE_PROVIDER_SITE_OTHER): Payer: Medicaid Other | Admitting: Obstetrics and Gynecology

## 2017-06-03 VITALS — BP 100/58 | HR 86 | Wt 150.2 lb

## 2017-06-03 DIAGNOSIS — Z3492 Encounter for supervision of normal pregnancy, unspecified, second trimester: Secondary | ICD-10-CM

## 2017-06-03 LAB — POCT URINALYSIS DIPSTICK
Bilirubin, UA: NEGATIVE
Blood, UA: NEGATIVE
GLUCOSE UA: NEGATIVE
KETONES UA: NEGATIVE
Leukocytes, UA: NEGATIVE
Nitrite, UA: NEGATIVE
Protein, UA: NEGATIVE
SPEC GRAV UA: 1.01 (ref 1.010–1.025)
UROBILINOGEN UA: 0.2 U/dL
pH, UA: 6 (ref 5.0–8.0)

## 2017-06-03 NOTE — Progress Notes (Signed)
ROB- pt is doing well 

## 2017-06-03 NOTE — Progress Notes (Signed)
ROB-discussed anatomy scan and LLP, will repeat scan next visit. Discussed previous deliveies at term  and PPH with elevated blood pressures, stayed approx. 5 days for each delivery.

## 2017-06-10 ENCOUNTER — Encounter: Payer: Self-pay | Admitting: Certified Nurse Midwife

## 2017-06-11 ENCOUNTER — Ambulatory Visit (INDEPENDENT_AMBULATORY_CARE_PROVIDER_SITE_OTHER): Payer: Medicaid Other | Admitting: Obstetrics and Gynecology

## 2017-06-11 VITALS — BP 100/58 | HR 81 | Wt 149.9 lb

## 2017-06-11 DIAGNOSIS — N760 Acute vaginitis: Secondary | ICD-10-CM

## 2017-06-11 DIAGNOSIS — Z3492 Encounter for supervision of normal pregnancy, unspecified, second trimester: Secondary | ICD-10-CM | POA: Diagnosis not present

## 2017-06-11 DIAGNOSIS — B9689 Other specified bacterial agents as the cause of diseases classified elsewhere: Secondary | ICD-10-CM

## 2017-06-11 LAB — POCT URINALYSIS DIPSTICK
Bilirubin, UA: NEGATIVE
Blood, UA: NEGATIVE
Glucose, UA: NEGATIVE
Ketones, UA: NEGATIVE
Leukocytes, UA: NEGATIVE
Nitrite, UA: NEGATIVE
PROTEIN UA: NEGATIVE
SPEC GRAV UA: 1.01 (ref 1.010–1.025)
Urobilinogen, UA: 0.2 E.U./dL
pH, UA: 6.5 (ref 5.0–8.0)

## 2017-06-11 MED ORDER — METRONIDAZOLE 500 MG PO TABS: 500.0000 mg | ORAL_TABLET | Freq: Two times a day (BID) | ORAL | 0 refills | Status: DC

## 2017-06-11 NOTE — Progress Notes (Signed)
Work in [employer]- reports return of vaginal discharge with odor, like she had previously with BV. First noted yesterday. Denies vaginal itching or bleeding good FM. Pelvic exam: VULVA: normal appearing vulva with no masses, tenderness or lesions, VAGINA: normal appearing vagina with normal color and discharge, no lesions, WET MOUNT done - results: clue cells. Whiff+, prescription sent for po flagyl and instructed on use.

## 2017-06-11 NOTE — Progress Notes (Signed)
OB WORK IN- pt is c/o vaginal d/c with odor x2 days

## 2017-06-17 ENCOUNTER — Encounter: Payer: Self-pay | Admitting: Certified Nurse Midwife

## 2017-07-03 ENCOUNTER — Encounter: Payer: Self-pay | Admitting: Certified Nurse Midwife

## 2017-07-03 ENCOUNTER — Ambulatory Visit (INDEPENDENT_AMBULATORY_CARE_PROVIDER_SITE_OTHER): Payer: Medicaid Other

## 2017-07-03 ENCOUNTER — Ambulatory Visit: Payer: Medicaid Other

## 2017-07-03 ENCOUNTER — Ambulatory Visit (INDEPENDENT_AMBULATORY_CARE_PROVIDER_SITE_OTHER): Payer: Medicaid Other | Admitting: Certified Nurse Midwife

## 2017-07-03 ENCOUNTER — Other Ambulatory Visit: Payer: Self-pay | Admitting: Certified Nurse Midwife

## 2017-07-03 VITALS — BP 86/58 | HR 71 | Wt 157.8 lb

## 2017-07-03 DIAGNOSIS — Z3492 Encounter for supervision of normal pregnancy, unspecified, second trimester: Secondary | ICD-10-CM

## 2017-07-03 DIAGNOSIS — R82998 Other abnormal findings in urine: Secondary | ICD-10-CM

## 2017-07-03 DIAGNOSIS — Z131 Encounter for screening for diabetes mellitus: Secondary | ICD-10-CM

## 2017-07-03 DIAGNOSIS — Z3483 Encounter for supervision of other normal pregnancy, third trimester: Secondary | ICD-10-CM

## 2017-07-03 DIAGNOSIS — Z23 Encounter for immunization: Secondary | ICD-10-CM

## 2017-07-03 DIAGNOSIS — Z130 Encounter for screening for diseases of the blood and blood-forming organs and certain disorders involving the immune mechanism: Secondary | ICD-10-CM

## 2017-07-03 LAB — POCT URINALYSIS DIPSTICK
BILIRUBIN UA: NEGATIVE
Blood, UA: NEGATIVE
GLUCOSE UA: NEGATIVE
Ketones, UA: NEGATIVE
NITRITE UA: NEGATIVE
Protein, UA: NEGATIVE
Spec Grav, UA: 1.015 (ref 1.010–1.025)
Urobilinogen, UA: 0.2 E.U./dL
pH, UA: 6.5 (ref 5.0–8.0)

## 2017-07-03 NOTE — Progress Notes (Signed)
ROB- btc, tdap, gtt, flu vaccine- done.

## 2017-07-03 NOTE — Progress Notes (Signed)
ROB-Pt doing well. Follow up US wnl, low lying placenta resolved. Results reviewed with pt and significant other, verbalized understanding. Childbirth class schedule given. Education regarding cord blood banking options. Desires epidural. Plans formula feeding and Depo PP. Reports round ligament pain, encouraged home treatment measures including the use of abdominal support. CBC, glucola, TDaP, and Flu today. Reviewed red flag symptoms and when to call. RTC x 2 weeks for ROB or sooner if needed.   ULTRASOUND REPORT  Location: [facility] Date of Service: 07/03/17  Indications: F/U Low-lying placenta Findings:  Singleton intrauterine pregnancy is visualized with FHR at 155 BPM.   Fetal presentation is vertex.  Placenta: Posterior and grade 1.  Placenta is at least 4.0cm from cervical os.  Image taken transabdominally demonstrates 4.8cm from os.  On transvaginal examination, the placenta is not able to be seen in view and is at least 4.0cm from cervical os. AFI: Appears WNL subjectively.  MVP = 4.4cm  Gender - female.   Impression: 1. Placenta is no longer low-lying.  Recommendations: 1.Clinical correlation with the patient's History and Physical Exam.

## 2017-07-03 NOTE — Patient Instructions (Addendum)
Tdap Vaccine (Tetanus, Diphtheria and Pertussis): What You Need to Know 1. Why get vaccinated? Tetanus, diphtheria and pertussis are very serious diseases. Tdap vaccine can protect us from these diseases. And, Tdap vaccine given to pregnant women can protect newborn babies against pertussis. TETANUS (Lockjaw) is rare in the [place] today. It causes painful muscle tightening and stiffness, usually all over the body.  It can lead to tightening of muscles in the head and neck so you can't open your mouth, swallow, or sometimes even breathe. Tetanus kills about 1 out of 10 people who are infected even after receiving the best medical care.  DIPHTHERIA is also rare in the [place] today. It can cause a thick coating to form in the back of the throat.  It can lead to breathing problems, heart failure, paralysis, and death.  PERTUSSIS (Whooping Cough) causes severe coughing spells, which can cause difficulty breathing, vomiting and disturbed sleep.  It can also lead to weight loss, incontinence, and rib fractures. Up to 2 in 100 adolescents and 5 in 100 adults with pertussis are hospitalized or have complications, which could include pneumonia or death.  These diseases are caused by bacteria. Diphtheria and pertussis are spread from person to person through secretions from coughing or sneezing. Tetanus enters the body through cuts, scratches, or wounds. Before vaccines, as many as 200,000 cases of diphtheria, 200,000 cases of pertussis, and hundreds of cases of tetanus, were reported in the [place] each year. Since vaccination began, reports of cases for tetanus and diphtheria have dropped by about 99% and for pertussis by about 80%. 2. Tdap vaccine Tdap vaccine can protect adolescents and adults from tetanus, diphtheria, and pertussis. One dose of Tdap is routinely given at age 11 or 12. People who did not get Tdap at that age should get it as soon as possible. Tdap is especially  important for healthcare professionals and anyone having close contact with a baby younger than 12 months. Pregnant women should get a dose of Tdap during every pregnancy, to protect the newborn from pertussis. Infants are most at risk for severe, life-threatening complications from pertussis. Another vaccine, called Td, protects against tetanus and diphtheria, but not pertussis. A Td booster should be given every 10 years. Tdap may be given as one of these boosters if you have never gotten Tdap before. Tdap may also be given after a severe cut or burn to prevent tetanus infection. Your doctor or the person giving you the vaccine can give you more information. Tdap may safely be given at the same time as other vaccines. 3. Some people should not get this vaccine  A person who has ever had a life-threatening allergic reaction after a previous dose of any diphtheria, tetanus or pertussis containing vaccine, OR has a severe allergy to any part of this vaccine, should not get Tdap vaccine. Tell the person giving the vaccine about any severe allergies.  Anyone who had coma or long repeated seizures within 7 days after a childhood dose of DTP or DTaP, or a previous dose of Tdap, should not get Tdap, unless a cause other than the vaccine was found. They can still get Td.  Talk to your doctor if you: ? have seizures or another nervous system problem, ? had severe pain or swelling after any vaccine containing diphtheria, tetanus or pertussis, ? ever had a condition called Guillain-Barr Syndrome (GBS), ? aren't feeling well on the day the shot is scheduled. 4. Risks With any medicine, including   vaccines, there is a chance of side effects. These are usually mild and go away on their own. Serious reactions are also possible but are rare. Most people who get Tdap vaccine do not have any problems with it. Mild problems following Tdap: (Did not interfere with activities)  Pain where the shot was given (about  3 in 4 adolescents or 2 in 3 adults)  Redness or swelling where the shot was given (about 1 person in 5)  Mild fever of at least 100.4F (up to about 1 in 25 adolescents or 1 in 100 adults)  Headache (about 3 or 4 people in 10)  Tiredness (about 1 person in 3 or 4)  Nausea, vomiting, diarrhea, stomach ache (up to 1 in 4 adolescents or 1 in 10 adults)  Chills, sore joints (about 1 person in 10)  Body aches (about 1 person in 3 or 4)  Rash, swollen glands (uncommon)  Moderate problems following Tdap: (Interfered with activities, but did not require medical attention)  Pain where the shot was given (up to 1 in 5 or 6)  Redness or swelling where the shot was given (up to about 1 in 16 adolescents or 1 in 12 adults)  Fever over 102F (about 1 in 100 adolescents or 1 in 250 adults)  Headache (about 1 in 7 adolescents or 1 in 10 adults)  Nausea, vomiting, diarrhea, stomach ache (up to 1 or 3 people in 100)  Swelling of the entire arm where the shot was given (up to about 1 in 500).  Severe problems following Tdap: (Unable to perform usual activities; required medical attention)  Swelling, severe pain, bleeding and redness in the arm where the shot was given (rare).  Problems that could happen after any vaccine:  People sometimes faint after a medical procedure, including vaccination. Sitting or lying down for about 15 minutes can help prevent fainting, and injuries caused by a fall. Tell your doctor if you feel dizzy, or have vision changes or ringing in the ears.  Some people get severe pain in the shoulder and have difficulty moving the arm where a shot was given. This happens very rarely.  Any medication can cause a severe allergic reaction. Such reactions from a vaccine are very rare, estimated at fewer than 1 in a million doses, and would happen within a few minutes to a few hours after the vaccination. As with any medicine, there is a very remote chance of a vaccine  causing a serious injury or death. The safety of vaccines is always being monitored. For more information, visit: www.cdc.gov/vaccinesafety/ 5. What if there is a serious problem? What should I look for? Look for anything that concerns you, such as signs of a severe allergic reaction, very high fever, or unusual behavior. Signs of a severe allergic reaction can include hives, swelling of the face and throat, difficulty breathing, a fast heartbeat, dizziness, and weakness. These would usually start a few minutes to a few hours after the vaccination. What should I do?  If you think it is a severe allergic reaction or other emergency that can't wait, call 9-1-1 or get the person to the nearest hospital. Otherwise, call your doctor.  Afterward, the reaction should be reported to the Vaccine Adverse Event Reporting System (VAERS). Your doctor might file this report, or you can do it yourself through the VAERS web site at www.vaers.hhs.gov, or by calling 1-800-822-7967. ? VAERS does not give medical advice. 6. The National Vaccine Injury Compensation Program The National   Vaccine Injury Compensation Program (VICP) is a federal program that was created to compensate people who may have been injured by certain vaccines. Persons who believe they may have been injured by a vaccine can learn about the program and about filing a claim by calling 281-047-9861 or visiting the [place] website at [email or web address]. There is a time limit to file a claim for compensation. 7. How can I learn more?  Ask your doctor. He or she can give you the vaccine package insert or suggest other sources of information.  Call your local or state health department.  Contact the Centers for Disease Control and Prevention (CDC): ? Call 775-544-6451 (1-800-CDC-INFO) or ? Visit CDC's website at http://hunter.com/ CDC Tdap Vaccine VIS (11/15/13) This information is not intended to replace advice given to you by your  health care provider. Make sure you discuss any questions you have with your health care provider. Document Released: 03/09/2012 Document Revised: 05/29/2016 Document Reviewed: 05/29/2016 Elsevier Interactive Patient Education  2017 [place]. Influenza (Flu) Vaccine (Inactivated or Recombinant): What You Need to Know 1. Why get vaccinated? Influenza ("flu") is a contagious disease that spreads around the [place] every year, usually between October and May. Flu is caused by influenza viruses, and is spread mainly by coughing, sneezing, and close contact. Anyone can get flu. Flu strikes suddenly and can last several days. Symptoms vary by age, but can include:  fever/chills  sore throat  muscle aches  fatigue  cough  headache  runny or stuffy nose  Flu can also lead to pneumonia and blood infections, and cause diarrhea and seizures in children. If you have a medical condition, such as heart or lung disease, flu can make it worse. Flu is more dangerous for some people. Infants and young children, people 46 years of age and older, pregnant women, and people with certain health conditions or a weakened immune system are at greatest risk. Each year thousands of people in the [place] die from flu, and many more are hospitalized. Flu vaccine can:  keep you from getting flu,  make flu less severe if you do get it, and  keep you from spreading flu to your family and other people. 2. Inactivated and recombinant flu vaccines A dose of flu vaccine is recommended every flu season. Children 6 months through 70 years of age may need two doses during the same flu season. Everyone else needs only one dose each flu season. Some inactivated flu vaccines contain a very small amount of a mercury-based preservative called thimerosal. Studies have not shown thimerosal in vaccines to be harmful, but flu vaccines that do not contain thimerosal are available. There is no live flu virus in  flu shots. They cannot cause the flu. There are many flu viruses, and they are always changing. Each year a new flu vaccine is made to protect against three or four viruses that are likely to cause disease in the upcoming flu season. But even when the vaccine doesn't exactly match these viruses, it may still provide some protection. Flu vaccine cannot prevent:  flu that is caused by a virus not covered by the vaccine, or  illnesses that look like flu but are not.  It takes about 2 weeks for protection to develop after vaccination, and protection lasts through the flu season. 3. Some people should not get this vaccine Tell the person who is giving you the vaccine:  If you have any severe, life-threatening allergies. If you ever had a life-threatening allergic  reaction after a dose of flu vaccine, or have a severe allergy to any part of this vaccine, you may be advised not to get vaccinated. Most, but not all, types of flu vaccine contain a small amount of egg protein.  If you ever had Guillain-Barr Syndrome (also called GBS). Some people with a history of GBS should not get this vaccine. This should be discussed with your doctor.  If you are not feeling well. It is usually okay to get flu vaccine when you have a mild illness, but you might be asked to come back when you feel better.  4. Risks of a vaccine reaction With any medicine, including vaccines, there is a chance of reactions. These are usually mild and go away on their own, but serious reactions are also possible. Most people who get a flu shot do not have any problems with it. Minor problems following a flu shot include:  soreness, redness, or swelling where the shot was given  hoarseness  sore, red or itchy eyes  cough  fever  aches  headache  itching  fatigue  If these problems occur, they usually begin soon after the shot and last 1 or 2 days. More serious problems following a flu shot can include the  following:  There may be a small increased risk of Guillain-Barre Syndrome (GBS) after inactivated flu vaccine. This risk has been estimated at 1 or 2 additional cases per million people vaccinated. This is much lower than the risk of severe complications from flu, which can be prevented by flu vaccine.  Young children who get the flu shot along with pneumococcal vaccine (PCV13) and/or DTaP vaccine at the same time might be slightly more likely to have a seizure caused by fever. Ask your doctor for more information. Tell your doctor if a child who is getting flu vaccine has ever had a seizure.  Problems that could happen after any injected vaccine:  People sometimes faint after a medical procedure, including vaccination. Sitting or lying down for about 15 minutes can help prevent fainting, and injuries caused by a fall. Tell your doctor if you feel dizzy, or have vision changes or ringing in the ears.  Some people get severe pain in the shoulder and have difficulty moving the arm where a shot was given. This happens very rarely.  Any medication can cause a severe allergic reaction. Such reactions from a vaccine are very rare, estimated at about 1 in a million doses, and would happen within a few minutes to a few hours after the vaccination. As with any medicine, there is a very remote chance of a vaccine causing a serious injury or death. The safety of vaccines is always being monitored. For more information, visit: http://www.aguilar.org/ 5. What if there is a serious reaction? What should I look for? Look for anything that concerns you, such as signs of a severe allergic reaction, very high fever, or unusual behavior. Signs of a severe allergic reaction can include hives, swelling of the face and throat, difficulty breathing, a fast heartbeat, dizziness, and weakness. These would start a few minutes to a few hours after the vaccination. What should I do?  If you think it is a severe  allergic reaction or other emergency that can't wait, call 9-1-1 and get the person to the nearest hospital. Otherwise, call your doctor.  Reactions should be reported to the Vaccine Adverse Event Reporting System (VAERS). Your doctor should file this report, or you can do it yourself through  the VAERS web site at www.vaers.SamedayNews.es, or by calling 404 179 5534. ? VAERS does not give medical advice. 6. The National Vaccine Injury Compensation Program The [employer] Vaccine Injury Compensation Program (VICP) is a federal program that was created to compensate people who may have been injured by certain vaccines. Persons who believe they may have been injured by a vaccine can learn about the program and about filing a claim by calling (734)585-1099 or visiting the [place] website at [email or web address].ee. There is a time limit to file a claim for compensation. 7. How can I learn more?  Ask your healthcare provider. He or she can give you the vaccine package insert or suggest other sources of information.  Call your local or state health department.  Contact the Centers for Disease Control and Prevention (CDC): ? Call 4385053403 (1-800-CDC-INFO) or ? Visit CDC's website at https://gibson.com/ Vaccine Information Statement, Inactivated Influenza Vaccine (04/28/2014) This information is not intended to replace advice given to you by your health care provider. Make sure you discuss any questions you have with your health care provider. Document Released: 07/03/2006 Document Revised: 05/29/2016 Document Reviewed: 05/29/2016 Elsevier Interactive Patient Education  2017 [place] 2018 Prenatal Education Class Schedule Register at [email or web address] in the [place] or call Kerman Passey at [phone] 9:00a-5:00p M-F  Childbirth Preparation Certified Childbirth Educators teach this 5 week course.  Expectant parents are encouraged to take this  class in their 3rd trimester, completing it by their 35-36th week. Meets in [facility], Lower Level.  Mondays Thursdays  7:00-9:00 p 7:00-9:00 p  July 23 - August 20 July 19 - August 16  September 17 - October 15 September 6 -October 4  November 5 - December 3 October 25 - November 29   No Class on Thanksgiving Day -November 22  Childbirth Preparation Refresher Course For those who have previously attended Prepared Childbirth Preparation classes, this class in incorporated into the 3rd and 4th classes in the Monday night childbirth series.  Course meets in the [facility]. Lower Level from 7:00p - 9:00p  August 6 & 13  October 1 & 8  November 19 & 26   Weekend Childbirth Waymon Amato Classes are held Saturday & Sunday, 1:00 5:00p Course meets in [facility] Level  August 4 & 5  November 3 & 4    The BirthPlace Tours Free tours are held on the third Sunday of each month at 3 pm.  The tour meets in the third floor waiting area and will take approximately 30 minutes.  Tours are also included in Childbirth class series as well as Brother/Sister class.  An online virtual tour can be seen at [email or web address].         Breastfeeding & Infant Nutrition The course incorporates returning to work or school.  Breast milk collection and storage with basic breastfeeding and infant nutrition. This two-class course is held the 2nd and 3rd Tuesday of each month from 7:00 -9:00 pm.  Course meets in the [place] [facility] Lower level  June 12 & 19 July-No Class  August 14 & 21 September 11 &18  September 11 & 18 October 9 &16  November 13 & 20 December 11 & 18   [employer] welcomes any mother for a social outing with other Moms to share experiences and challenges in an informal setting.  Meets the 1st Thursday and 3rd Thursday 11:30a-1:00 pm of each month in [facility] 3rd floor classroom.  No registration required.  Newborn Essentials This  course covers bathing, diapering, swaddling and more with practice on lifelike dolls.  Participants will also learn safety tips and infant CPR (Not for certification).  It is held the 1st Wednesday of each month from 7:00p-9:00p in the [facility], Lower level.  June 6 July- No Class  August 1 September 5  October 3 November 7  December 7    Preparing Big Brother & Sister This one session course prepares children and their parents for the arrival of a new baby.  It is held on the 1st Tuesday of each month from 6:30p - 8:00p. Course meets in the [facility], Lower level.  July-No Class August 7  September 4 October 2  November 6 December 4   [employer] Dads This nationally acclaimed class helps expecting and new dads with the basic skills and confidence to bond with their infants, support their mates, and provide a safe and healthy home environment for their new family. Classes are held the 2nd Saturday of every month from 9:00a - 12:00 noon.  Course meets in the [facility] Lower level.  June 9 August 11  October 13 No Class in December   Breastfeeding Deciding to breastfeed is one of the best choices you can make for you and your baby. A change in hormones during pregnancy causes your breast tissue to grow and increases the number and size of your milk ducts. These hormones also allow proteins, sugars, and fats from your blood supply to make breast milk in your milk-producing glands. Hormones prevent breast milk from being released before your baby is born as well as prompt milk flow after birth. Once breastfeeding has begun, thoughts of your baby, as well as his or her sucking or crying, can stimulate the release of milk from your milk-producing glands. Benefits of breastfeeding For Your Baby  Your first milk (colostrum) helps your baby's digestive system function better.  There are antibodies in your milk that help your baby fight off  infections.  Your baby has a lower incidence of asthma, allergies, and sudden infant death syndrome.  The nutrients in breast milk are better for your baby than infant formulas and are designed uniquely for your baby's needs.  Breast milk improves your baby's brain development.  Your baby is less likely to develop other conditions, such as childhood obesity, asthma, or type 2 diabetes mellitus.  For You  Breastfeeding helps to create a very special bond between you and your baby.  Breastfeeding is convenient. Breast milk is always available at the correct temperature and costs nothing.  Breastfeeding helps to burn calories and helps you lose the weight gained during pregnancy.  Breastfeeding makes your uterus contract to its prepregnancy size faster and slows bleeding (lochia) after you give birth.  Breastfeeding helps to lower your risk of developing type 2 diabetes mellitus, osteoporosis, and breast or ovarian cancer later in life.  Signs that your baby is hungry Early Signs of Hunger  Increased alertness or activity.  Stretching.  Movement of the head from side to side.  Movement of the head and opening of the mouth when the corner of the mouth or cheek is stroked (rooting).  Increased sucking sounds, smacking lips, cooing, sighing, or squeaking.  Hand-to-mouth movements.  Increased sucking of fingers or hands.  Late Signs of Hunger  Fussing.  Intermittent crying.  Extreme Signs of Hunger Signs of extreme hunger will require calming and consoling  before your baby will be able to breastfeed successfully. Do not wait for the following signs of extreme hunger to occur before you initiate breastfeeding:  Restlessness.  A loud, strong cry.  Screaming.  Breastfeeding basics Breastfeeding Initiation  Find a comfortable place to sit or lie down, with your neck and back well supported.  Place a pillow or rolled up blanket under your baby to bring him or her to the  level of your breast (if you are seated). Nursing pillows are specially designed to help support your arms and your baby while you breastfeed.  Make sure that your baby's abdomen is facing your abdomen.  Gently massage your breast. With your fingertips, massage from your chest wall toward your nipple in a circular motion. This encourages milk flow. You may need to continue this action during the feeding if your milk flows slowly.  Support your breast with 4 fingers underneath and your thumb above your nipple. Make sure your fingers are well away from your nipple and your baby's mouth.  Stroke your baby's lips gently with your finger or nipple.  When your baby's mouth is open wide enough, quickly bring your baby to your breast, placing your entire nipple and as much of the colored area around your nipple (areola) as possible into your baby's mouth. ? More areola should be visible above your baby's upper lip than below the lower lip. ? Your baby's tongue should be between his or her lower gum and your breast.  Ensure that your baby's mouth is correctly positioned around your nipple (latched). Your baby's lips should create a seal on your breast and be turned out (everted).  It is common for your baby to suck about 2-3 minutes in order to start the flow of breast milk.  Latching Teaching your baby how to latch on to your breast properly is very important. An improper latch can cause nipple pain and decreased milk supply for you and poor weight gain in your baby. Also, if your baby is not latched onto your nipple properly, he or she may swallow some air during feeding. This can make your baby fussy. Burping your baby when you switch breasts during the feeding can help to get rid of the air. However, teaching your baby to latch on properly is still the best way to prevent fussiness from swallowing air while breastfeeding. Signs that your baby has successfully latched on to your nipple:  Silent  tugging or silent sucking, without causing you pain.  Swallowing heard between every 3-4 sucks.  Muscle movement above and in front of his or her ears while sucking.  Signs that your baby has not successfully latched on to nipple:  Sucking sounds or smacking sounds from your baby while breastfeeding.  Nipple pain.  If you think your baby has not latched on correctly, slip your finger into the corner of your baby's mouth to break the suction and place it between your baby's gums. Attempt breastfeeding initiation again. Signs of Successful Breastfeeding Signs from your baby:  A gradual decrease in the number of sucks or complete cessation of sucking.  Falling asleep.  Relaxation of his or her body.  Retention of a small amount of milk in his or her mouth.  Letting go of your breast by himself or herself.  Signs from you:  Breasts that have increased in firmness, weight, and size 1-3 hours after feeding.  Breasts that are softer immediately after breastfeeding.  Increased milk volume, as well as a  change in milk consistency and color by the fifth day of breastfeeding.  Nipples that are not sore, cracked, or bleeding.  Signs That Your [employer] Books is Getting Enough Milk  Wetting at least 1-2 diapers during the first 24 hours after birth.  Wetting at least 5-6 diapers every 24 hours for the first week after birth. The urine should be clear or pale yellow by 5 days after birth.  Wetting 6-8 diapers every 24 hours as your baby continues to grow and develop.  At least 3 stools in a 24-hour period by age 522 days. The stool should be soft and yellow.  At least 3 stools in a 24-hour period by age 81 days. The stool should be seedy and yellow.  No loss of weight greater than 10% of birth weight during the first 8 days of age.  Average weight gain of 4-7 ounces (113-198 g) per week after age 52 days.  Consistent daily weight gain by age 529 days, without weight loss after the age of 2  weeks.  After a feeding, your baby may spit up a small amount. This is common. Breastfeeding frequency and duration Frequent feeding will help you make more milk and can prevent sore nipples and breast engorgement. Breastfeed when you feel the need to reduce the fullness of your breasts or when your baby shows signs of hunger. This is called "breastfeeding on demand." Avoid introducing a pacifier to your baby while you are working to establish breastfeeding (the first 4-6 weeks after your baby is born). After this time you may choose to use a pacifier. Research has shown that pacifier use during the first year of a baby's life decreases the risk of sudden infant death syndrome (SIDS). Allow your baby to feed on each breast as long as he or she wants. Breastfeed until your baby is finished feeding. When your baby unlatches or falls asleep while feeding from the first breast, offer the second breast. Because newborns are often sleepy in the first few weeks of life, you may need to awaken your baby to get him or her to feed. Breastfeeding times will vary from baby to baby. However, the following rules can serve as a guide to help you ensure that your baby is properly fed:  Newborns (babies 34 weeks of age or younger) may breastfeed every 1-3 hours.  Newborns should not go longer than 3 hours during the day or 5 hours during the night without breastfeeding.  You should breastfeed your baby a minimum of 8 times in a 24-hour period until you begin to introduce solid foods to your baby at around 32 months of age.  Breast milk pumping Pumping and storing breast milk allows you to ensure that your baby is exclusively fed your breast milk, even at times when you are unable to breastfeed. This is especially important if you are going back to work while you are still breastfeeding or when you are not able to be present during feedings. Your lactation consultant can give you guidelines on how long it is safe to  store breast milk. A breast pump is a machine that allows you to pump milk from your breast into a sterile bottle. The pumped breast milk can then be stored in a refrigerator or freezer. Some breast pumps are operated by hand, while others use electricity. Ask your lactation consultant which type will work best for you. Breast pumps can be purchased, but some hospitals and breastfeeding support groups lease breast pumps on a  monthly basis. A lactation consultant can teach you how to hand express breast milk, if you prefer not to use a pump. Caring for your breasts while you breastfeed Nipples can become dry, cracked, and sore while breastfeeding. The following recommendations can help keep your breasts moisturized and healthy:  Avoid using soap on your nipples.  Wear a supportive bra. Although not required, special nursing bras and tank tops are designed to allow access to your breasts for breastfeeding without taking off your entire bra or top. Avoid wearing underwire-style bras or extremely tight bras.  Air dry your nipples for 3-55minutes after each feeding.  Use only cotton bra pads to absorb leaked breast milk. Leaking of breast milk between feedings is normal.  Use lanolin on your nipples after breastfeeding. Lanolin helps to maintain your skin's normal moisture barrier. If you use pure lanolin, you do not need to wash it off before feeding your baby again. Pure lanolin is not toxic to your baby. You may also hand express a few drops of breast milk and gently massage that milk into your nipples and allow the milk to air dry.  In the first few weeks after giving birth, some women experience extremely full breasts (engorgement). Engorgement can make your breasts feel heavy, warm, and tender to the touch. Engorgement peaks within 3-5 days after you give birth. The following recommendations can help ease engorgement:  Completely empty your breasts while breastfeeding or pumping. You may want to  start by applying warm, moist heat (in the shower or with warm water-soaked hand towels) just before feeding or pumping. This increases circulation and helps the milk flow. If your baby does not completely empty your breasts while breastfeeding, pump any extra milk after he or she is finished.  Wear a snug bra (nursing or regular) or tank top for 1-2 days to signal your body to slightly decrease milk production.  Apply ice packs to your breasts, unless this is too uncomfortable for you.  Make sure that your baby is latched on and positioned properly while breastfeeding.  If engorgement persists after 48 hours of following these recommendations, contact your health care provider or a Science writer. Overall health care recommendations while breastfeeding  Eat healthy foods. Alternate between meals and snacks, eating 3 of each per day. Because what you eat affects your breast milk, some of the foods may make your baby more irritable than usual. Avoid eating these foods if you are sure that they are negatively affecting your baby.  Drink milk, fruit juice, and water to satisfy your thirst (about 10 glasses a day).  Rest often, relax, and continue to take your prenatal vitamins to prevent fatigue, stress, and anemia.  Continue breast self-awareness checks.  Avoid chewing and smoking tobacco. Chemicals from cigarettes that pass into breast milk and exposure to secondhand smoke may harm your baby.  Avoid alcohol and drug use, including marijuana. Some medicines that may be harmful to your baby can pass through breast milk. It is important to ask your health care provider before taking any medicine, including all over-the-counter and prescription medicine as well as vitamin and herbal supplements. It is possible to become pregnant while breastfeeding. If birth control is desired, ask your health care provider about options that will be safe for your baby. Contact a health care provider  if:  You feel like you want to stop breastfeeding or have become frustrated with breastfeeding.  You have painful breasts or nipples.  Your nipples are cracked or  bleeding.  Your breasts are red, tender, or warm.  You have a swollen area on either breast.  You have a fever or chills.  You have nausea or vomiting.  You have drainage other than breast milk from your nipples.  Your breasts do not become full before feedings by the fifth day after you give birth.  You feel sad and depressed.  Your baby is too sleepy to eat well.  Your baby is having trouble sleeping.  Your baby is wetting less than 3 diapers in a 24-hour period.  Your baby has less than 3 stools in a 24-hour period.  Your baby's skin or the white part of his or her eyes becomes yellow.  Your baby is not gaining weight by 42 days of age. Get help right away if:  Your baby is overly tired (lethargic) and does not want to wake up and feed.  Your baby develops an unexplained fever. This information is not intended to replace advice given to you by your health care provider. Make sure you discuss any questions you have with your health care provider. Document Released: 09/08/2005 Document Revised: 02/20/2016 Document Reviewed: 03/02/2013 Elsevier Interactive Patient Education  2017 [employer].

## 2017-07-05 LAB — CBC
HEMOGLOBIN: 10.8 g/dL — AB (ref 11.1–15.9)
Hematocrit: 31.3 % — ABNORMAL LOW (ref 34.0–46.6)
MCH: 32.6 pg (ref 26.6–33.0)
MCHC: 34.5 g/dL (ref 31.5–35.7)
MCV: 95 fL (ref 79–97)
Platelets: 210 10*3/uL (ref 150–379)
RBC: 3.31 x10E6/uL — ABNORMAL LOW (ref 3.77–5.28)
RDW: 13.2 % (ref 12.3–15.4)
WBC: 8 10*3/uL (ref 3.4–10.8)

## 2017-07-05 LAB — URINE CULTURE

## 2017-07-05 LAB — GLUCOSE, 1 HOUR GESTATIONAL: GESTATIONAL DIABETES SCREEN: 124 mg/dL (ref 65–139)

## 2017-07-09 ENCOUNTER — Other Ambulatory Visit: Payer: Self-pay | Admitting: Certified Nurse Midwife

## 2017-07-09 ENCOUNTER — Encounter: Payer: Self-pay | Admitting: Certified Nurse Midwife

## 2017-07-09 DIAGNOSIS — O99019 Anemia complicating pregnancy, unspecified trimester: Secondary | ICD-10-CM | POA: Insufficient documentation

## 2017-07-09 MED ORDER — FERROUS SULFATE 325 (65 FE) MG PO TABS
325.0000 mg | ORAL_TABLET | Freq: Every day | ORAL | 1 refills | Status: DC
Start: 2017-07-09 — End: 2017-08-10

## 2017-07-09 NOTE — Progress Notes (Signed)
ferr

## 2017-07-17 ENCOUNTER — Encounter: Payer: Medicaid Other | Admitting: Certified Nurse Midwife

## 2017-07-22 ENCOUNTER — Encounter: Payer: Self-pay | Admitting: Certified Nurse Midwife

## 2017-07-22 ENCOUNTER — Ambulatory Visit (INDEPENDENT_AMBULATORY_CARE_PROVIDER_SITE_OTHER): Payer: Medicaid Other | Admitting: Certified Nurse Midwife

## 2017-07-22 VITALS — BP 100/82 | HR 95 | Wt 160.2 lb

## 2017-07-22 DIAGNOSIS — Z3483 Encounter for supervision of other normal pregnancy, third trimester: Secondary | ICD-10-CM

## 2017-07-22 LAB — POCT URINALYSIS DIPSTICK
BILIRUBIN UA: NEGATIVE
Blood, UA: NEGATIVE
Glucose, UA: NEGATIVE
Ketones, UA: NEGATIVE
NITRITE UA: NEGATIVE
PH UA: 6 (ref 5.0–8.0)
Protein, UA: NEGATIVE
Spec Grav, UA: 1.01 (ref 1.010–1.025)
Urobilinogen, UA: 0.2 E.U./dL

## 2017-07-22 NOTE — Patient Instructions (Signed)
Cord Blood Banking Information Cord blood banking is the process of collecting and storing the blood that is in the umbilical cord and placenta at the time of delivery. This blood contains stem cells, which can be used to treat many blood diseases, immune system disorders, and childhood cancers. Stem cells can also be used to research certain diseases and treatments. Many people who choose cord blood banking donate the blood. Donated blood can be used in lifesaving treatments or for research. Other people choose to store the blood privately. Blood that is stored privately can only be used with the person's permission. This option is often chosen if:  A family member needs a stem cell transplant.  The child is part of an ethnic minority.  The child was conceived through in vitro fertilization.  What should I look for in a blood bank? A blood bank is the organization that coordinates cord blood banking. Make sure the cord blood bank that you use:  Is accredited.  Is financially stable.  Handles a large volume of cord blood samples.  Has a procedure in place for transport and storage.  Allows you the option of transferring your cord blood sample.  Has a procedure in place if the bank goes out of business.  Clearly states all costs and limits to future costs.  People who choose to donate cord blood should not need to pay for blood banking. People who keep the blood for private use will need to pay for the first (initial) storage and pay a fee each year (annual fee). Other fees may also apply. What are the risks of cord blood banking? There are no health risks associated with cord blood banking. It is considered safe. How should I prepare? You must schedule this process at least 4-6 weeks before you will be giving birth. How is the blood collected? The blood is collected as soon as the baby has been delivered. Within 15 minutes of delivery, a health care provider will take these actions  to collect the blood:  Clamp the umbilical cord at the top and bottom. This traps the blood in the umbilical cord.  Use a syringe or bag to collect the blood.  Insert needles into the placenta to collect (draw out) more blood.  What happens after the blood is collected? After the blood has been collected:  The blood will be sent to a blood bank.  The blood will be tested for genetic problems and infectious diseases. If the blood tests positive for a genetic problem or a disease, someone will contact you and let you know.  The blood will be frozen.  If your child develops a genetic condition, immune system disorder, or cancer, you will be responsible for contacting the blood bank and letting them know. This information is not intended to replace advice given to you by your health care provider. Make sure you discuss any questions you have with your health care provider. Document Released: 02/26/2010 Document Revised: 02/14/2016 Document Reviewed: 02/26/2015 Elsevier Interactive Patient Education  2018 [employer]. How a Baby Grows During Pregnancy Pregnancy begins when a female's sperm enters a female's egg (fertilization). This happens in one of the tubes (fallopian tubes) that connect the ovaries to the womb (uterus). The fertilized egg is called an embryo until it reaches 10 weeks. From 10 weeks until birth, it is called a fetus. The fertilized egg moves down the fallopian tube to the uterus. Then it implants into the lining of the uterus and begins  to grow. The developing fetus receives oxygen and nutrients through the pregnant woman's bloodstream and the tissues that grow (placenta) to support the fetus. The placenta is the life support system for the fetus. It provides nutrition and removes waste. Learning as much as you can about your pregnancy and how your baby is developing can help you enjoy the experience. It can also make you aware of when there might be a problem and when to ask  questions. How long does a typical pregnancy last? A pregnancy usually lasts 280 days, or about 40 weeks. Pregnancy is divided into three trimesters:  First trimester: 0-13 weeks.  Second trimester: 14-27 weeks.  Third trimester: 28-40 weeks.  The day when your baby is considered ready to be born (full term) is your estimated date of delivery. How does my baby develop month by month? First month  The fertilized egg attaches to the inside of the uterus.  Some cells will form the placenta. Others will form the fetus.  The arms, legs, brain, spinal cord, lungs, and heart begin to develop.  At the end of the first month, the heart begins to beat.  Second month  The bones, inner ear, eyelids, hands, and feet form.  The genitals develop.  By the end of 8 weeks, all major organs are developing.  Third month  All of the internal organs are forming.  Teeth develop below the gums.  Bones and muscles begin to grow. The spine can flex.  The skin is transparent.  Fingernails and toenails begin to form.  Arms and legs continue to grow longer, and hands and feet develop.  The fetus is about 3 in (7.6 cm) long.  Fourth month  The placenta is completely formed.  The external sex organs, neck, outer ear, eyebrows, eyelids, and fingernails are formed.  The fetus can hear, swallow, and move its arms and legs.  The kidneys begin to produce urine.  The skin is covered with a white waxy coating (vernix) and very fine hair (lanugo).  Fifth month  The fetus moves around more and can be felt for the first time (quickening).  The fetus starts to sleep and wake up and may begin to suck its finger.  The nails grow to the end of the fingers.  The organ in the digestive system that makes bile (gallbladder) functions and helps to digest the nutrients.  If your baby is a girl, eggs are present in her ovaries. If your baby is a boy, testicles start to move down into his  scrotum.  Sixth month  The lungs are formed, but the fetus is not yet able to breathe.  The eyes open. The brain continues to develop.  Your baby has fingerprints and toe prints. Your baby's hair grows thicker.  At the end of the second trimester, the fetus is about 9 in (22.9 cm) long.  Seventh month  The fetus kicks and stretches.  The eyes are developed enough to sense changes in light.  The hands can make a grasping motion.  The fetus responds to sound.  Eighth month  All organs and body systems are fully developed and functioning.  Bones harden and taste buds develop. The fetus may hiccup.  Certain areas of the brain are still developing. The skull remains soft.  Ninth month  The fetus gains about  lb (0.23 kg) each week.  The lungs are fully developed.  Patterns of sleep develop.  The fetus's head typically moves into a head-down position (  vertex) in the uterus to prepare for birth. If the buttocks move into a vertex position instead, the baby is breech.  The fetus weighs 6-9 lbs (2.72-4.08 kg) and is 19-20 in (48.26-50.8 cm) long.  What can I do to have a healthy pregnancy and help my baby develop? Eating and Drinking  Eat a healthy diet. ? Talk with your health care provider to make sure that you are getting the nutrients that you and your baby need. ? Visit www.BuildDNA.es to learn about creating a healthy diet.  Gain a healthy amount of weight during pregnancy as advised by your health care provider. This is usually 25-35 pounds. You may need to: ? Gain more if you were underweight before getting pregnant or if you are pregnant with more than one baby. ? Gain less if you were overweight or obese when you got pregnant.  Medicines and Vitamins  Take prenatal vitamins as directed by your health care provider. These include vitamins such as folic acid, iron, calcium, and vitamin D. They are important for healthy development.  Take medicines only  as directed by your health care provider. Read labels and ask a pharmacist or your health care provider whether over-the-counter medicines, supplements, and prescription drugs are safe to take during pregnancy.  Activities  Be physically active as advised by your health care provider. Ask your health care provider to recommend activities that are safe for you to do, such as walking or swimming.  Do not participate in strenuous or extreme sports.  Lifestyle  Do not drink alcohol.  Do not use any tobacco products, including cigarettes, chewing tobacco, or electronic cigarettes. If you need help quitting, ask your health care provider.  Do not use illegal drugs.  Safety  Avoid exposure to mercury, lead, or other heavy metals. Ask your health care provider about common sources of these heavy metals.  Avoid listeria infection during pregnancy. Follow these precautions: ? Do not eat soft cheeses or deli meats. ? Do not eat hot dogs unless they have been warmed up to the point of steaming, such as in the microwave oven. ? Do not drink unpasteurized milk.  Avoid toxoplasmosis infection during pregnancy. Follow these precautions: ? Do not change your cat's litter box, if you have a cat. Ask someone else to do this for you. ? Wear gardening gloves while working in the yard.  General Instructions  Keep all follow-up visits as directed by your health care provider. This is important. This includes prenatal care and screening tests.  Manage any chronic health conditions. Work closely with your health care provider to keep conditions, such as diabetes, under control.  How do I know if my baby is developing well? At each prenatal visit, your health care provider will do several different tests to check on your health and keep track of your baby's development. These include:  Fundal height. ? Your health care provider will measure your growing belly from top to bottom using a tape  measure. ? Your health care provider will also feel your belly to determine your baby's position.  Heartbeat. ? An ultrasound in the first trimester can confirm pregnancy and show a heartbeat, depending on how far along you are. ? Your health care provider will check your baby's heart rate at every prenatal visit. ? As you get closer to your delivery date, you may have regular fetal heart rate monitoring to make sure that your baby is not in distress.  Second trimester ultrasound. ? This ultrasound  checks your baby's development. It also indicates your baby's gender.  What should I do if I have concerns about my baby's development? Always talk with your health care provider about any concerns that you may have. This information is not intended to replace advice given to you by your health care provider. Make sure you discuss any questions you have with your health care provider. Document Released: 02/25/2008 Document Revised: 02/14/2016 Document Reviewed: 02/15/2014 Elsevier Interactive Patient Education  [employer].

## 2017-07-22 NOTE — Progress Notes (Signed)
ROB, doing well. Pt complains of white d/c with itching which is consistent with yeast. Instructed to to monistat 7 day course. She agrees to plan. She endourses good movement. PTL precautions reviewed. Follow up in 2 wks.   Philip Aspen, CNM

## 2017-08-06 ENCOUNTER — Encounter: Payer: Medicaid Other | Admitting: Certified Nurse Midwife

## 2017-08-06 ENCOUNTER — Encounter: Payer: Self-pay | Admitting: *Deleted

## 2017-08-06 ENCOUNTER — Observation Stay
Admission: EM | Admit: 2017-08-06 | Discharge: 2017-08-06 | Disposition: A | Discharge status: Home / Self Care | Payer: Medicaid Other | Attending: Obstetrics and Gynecology | Admitting: Obstetrics and Gynecology

## 2017-08-06 DIAGNOSIS — Z3A33 33 weeks gestation of pregnancy: Secondary | ICD-10-CM

## 2017-08-06 DIAGNOSIS — O99013 Anemia complicating pregnancy, third trimester: Principal | ICD-10-CM | POA: Insufficient documentation

## 2017-08-06 DIAGNOSIS — Z3490 Encounter for supervision of normal pregnancy, unspecified, unspecified trimester: Secondary | ICD-10-CM

## 2017-08-06 LAB — URINALYSIS, ROUTINE W REFLEX MICROSCOPIC
Bilirubin Urine: NEGATIVE
GLUCOSE, UA: NEGATIVE mg/dL
Hgb urine dipstick: NEGATIVE
Ketones, ur: NEGATIVE mg/dL
Nitrite: NEGATIVE
PH: 6 (ref 5.0–8.0)
Protein, ur: NEGATIVE mg/dL
SPECIFIC GRAVITY, URINE: 1.018 (ref 1.005–1.030)

## 2017-08-06 MED ORDER — ACETAMINOPHEN 500 MG PO TABS
1000.0000 mg | ORAL_TABLET | Freq: Once | ORAL | Status: AC
  Administered 2017-08-06: 1000 mg via ORAL
  Filled 2017-08-06: qty 2

## 2017-08-06 NOTE — Progress Notes (Signed)
CNM reviewed FHR tracing from home and orders received for discharge. Ordered to instruct pt to call office and make a follow up appt for the appointment missed today, as well as to drink 80-100 oz of fluid a day.

## 2017-08-06 NOTE — Progress Notes (Signed)
Pt discharged home in stable condition with significant other.

## 2017-08-06 NOTE — Progress Notes (Signed)
CNM notified of pt arrival to hospital and complaint. CNM aware of pt pain level, FHR tracing and the TOCO reading. Orders received and read back

## 2017-08-06 NOTE — OB Triage Note (Signed)
Pt arrived from home with complaints of lower abdominal cramping that started around 630pm while the pt was at work. Pt reports feeling this pain before but states, "it normally goes away really quick." pt denies any leaking of fluid or vaginal bleeding. Pt reports positive fetal movement. Pt rates pain 4/10. EFM and TOCO applied.

## 2017-08-06 NOTE — Discharge Instructions (Signed)
Call to make follow up appointment tomorrow for as soon as possible.  Drink 80-100 oz of water per day Use abdominal support while at work or on feet. Call/ return to hospital for contractions every 5 min, lasting 1 min, for 1 hour; Leaking of fluid or vaginal bleeding and decreased fetal movement.  See attached information sheet

## 2017-08-08 NOTE — Discharge Summary (Signed)
Obstetric Discharge Summary  Patient ID: Melanie York MRN: [id number] DOB/AGE: 17-Sep-1991 26 y.o.   Date of Admission: 08/06/2017 Melanie York, CNM Dellia Nims, MD)  Date of Discharge: 08/06/2017 Melanie York, CNM Dellia Nims, MD)  Admitting Diagnosis: Observation at [redacted]w[redacted]d  Secondary Diagnosis: Anemia in pregnancy     Discharge Diagnosis: No other diagnosis   Antepartum Procedures: NST   Brief Hospital Course   L&D OB Triage Note  Melanie York is a 26 y.o. G12P3003 female at [redacted]w[redacted]d, EDD Estimated Date of Delivery: 09/20/17 who presented to triage for complaints of intermittent lower abdominal cramping for the last hour.  She was evaluated by the nurses with no significant findings for fetal distress, uterine contractions, or preterm labor. Vital signs stable. An NST was performed and has been reviewed by CNM. She was treated with oral hydration and extra strength Tylenol.   NST INTERPRETATION: Indications: rule out uterine contractions  Mode: External Baseline Rate (A): 115 bpm Variability: Moderate Accelerations: 15 x 15 Decelerations: None  Contraction Frequency (min): none  Impression: reactive   Plan: NST performed was reviewed and was found to be reactive. She was discharged home with bleeding/labor precautions.  Continue routine prenatal care. Call office to reschedule today's missed visit.   Discharge Instructions: Per After Visit Summary.  Activity: Refer to After Visit Summary  Diet: Regular  Medications:  Allergies as of 08/06/2017      Reactions   Naproxen Rash      Medication List    ASK your doctor about these medications   CONCEPT DHA 53.5-38-1 MG Caps Take 1 capsule by mouth daily.   ferrous sulfate 325 (65 FE) MG tablet Commonly known as:  FERROUSUL Take 1 tablet (325 mg total) by mouth daily with breakfast.      Outpatient follow up:  Follow-up Information    [facility]. Schedule an appointment as soon as  possible for a visit in 1 day(s).   Contact information: [place]  [place] [phone]         Postpartum contraception: Depo-Provera  Discharged Condition: stable  Discharged to: home   Diona Fanti, CNM [facility], [facility]

## 2017-08-10 ENCOUNTER — Encounter: Payer: Self-pay | Admitting: Certified Nurse Midwife

## 2017-08-10 ENCOUNTER — Ambulatory Visit (INDEPENDENT_AMBULATORY_CARE_PROVIDER_SITE_OTHER): Payer: Medicaid Other | Admitting: Certified Nurse Midwife

## 2017-08-10 VITALS — BP 100/58 | HR 80 | Wt 166.8 lb

## 2017-08-10 DIAGNOSIS — Z3483 Encounter for supervision of other normal pregnancy, third trimester: Secondary | ICD-10-CM

## 2017-08-10 LAB — POCT URINALYSIS DIPSTICK
BILIRUBIN UA: NEGATIVE
GLUCOSE UA: NEGATIVE
Ketones, UA: NEGATIVE
Nitrite, UA: NEGATIVE
Protein, UA: NEGATIVE
RBC UA: NEGATIVE
SPEC GRAV UA: 1.01 (ref 1.010–1.025)
UROBILINOGEN UA: 0.2 U/dL
pH, UA: 7.5 (ref 5.0–8.0)

## 2017-08-10 NOTE — Patient Instructions (Signed)

## 2017-08-10 NOTE — Progress Notes (Signed)
Rob- no complaints.

## 2017-08-10 NOTE — Progress Notes (Signed)
ROB-Pt doing well, reports lower abdominal pressure and round ligament pain. Wears abdominal support when working and feels better. Encouraged increased use of abdominal support and other home treatment measures, pt verbalized understanding. Work note given. Anticipatory guidance regarding course of prenatal care. Reviewed red flag symptoms and when to call. RTC x 2 weeks for 36 week cultures and ROB.

## 2017-08-24 ENCOUNTER — Encounter: Payer: Self-pay | Admitting: Certified Nurse Midwife

## 2017-08-24 ENCOUNTER — Ambulatory Visit (INDEPENDENT_AMBULATORY_CARE_PROVIDER_SITE_OTHER): Payer: Medicaid Other | Admitting: Certified Nurse Midwife

## 2017-08-24 VITALS — BP 97/53 | HR 76 | Wt 166.2 lb

## 2017-08-24 DIAGNOSIS — Z3685 Encounter for antenatal screening for Streptococcus B: Secondary | ICD-10-CM

## 2017-08-24 DIAGNOSIS — Z113 Encounter for screening for infections with a predominantly sexual mode of transmission: Secondary | ICD-10-CM

## 2017-08-24 DIAGNOSIS — Z0283 Encounter for blood-alcohol and blood-drug test: Secondary | ICD-10-CM

## 2017-08-24 DIAGNOSIS — Z3493 Encounter for supervision of normal pregnancy, unspecified, third trimester: Secondary | ICD-10-CM

## 2017-08-24 LAB — POCT URINALYSIS DIPSTICK
Bilirubin, UA: NEGATIVE
Glucose, UA: NEGATIVE
KETONES UA: NEGATIVE
Leukocytes, UA: NEGATIVE
Nitrite, UA: NEGATIVE
PROTEIN UA: NEGATIVE
RBC UA: NEGATIVE
SPEC GRAV UA: 1.015 (ref 1.010–1.025)
UROBILINOGEN UA: 0.2 U/dL
pH, UA: 6 (ref 5.0–8.0)

## 2017-08-24 NOTE — Progress Notes (Signed)
ROB- cultures obtained, pt is doing well, having some fatigue

## 2017-08-24 NOTE — Progress Notes (Signed)
ROB, doing well. No complaints . Cultures today. Discussed labor precautions. Follow up 1 wk.   Philip Aspen, CNM

## 2017-08-24 NOTE — Patient Instructions (Signed)
Group B Streptococcus Infection During Pregnancy Group B Streptococcus (GBS) is a type of bacteria (Streptococcus agalactiae) that is often found in healthy people, commonly in the rectum, vagina, and intestines. In people who are healthy and not pregnant, the bacteria rarely cause serious illness or complications. However, women who test positive for GBS during pregnancy can pass the bacteria to their baby during childbirth, which can cause serious infection in the baby after birth. Women with GBS may also have infections during their pregnancy or immediately after childbirth, such as such as urinary tract infections (UTIs) or infections of the uterus (uterine infections). Having GBS also increases a woman's risk of complications during pregnancy, such as early (preterm) labor or delivery, miscarriage, or stillbirth. Routine testing (screening) for GBS is recommended for all pregnant women. What increases the risk? You may have a higher risk for GBS infection during pregnancy if you had one during a past pregnancy. What are the signs or symptoms? In most cases, GBS infection does not cause symptoms in pregnant women. Signs and symptoms of a possible GBS-related infection may include:  Labor starting before the 37th week of pregnancy.  A UTI or bladder infection, which may cause: ? Fever. ? Pain or burning during urination. ? Frequent urination.  Fever during labor, along with: ? Bad-smelling discharge. ? Uterine tenderness. ? Rapid heartbeat in the mother, baby, or both.  Rare but serious symptoms of a possible GBS-related infection in women include:  Blood infection (septicemia). This may cause fever, chills, or confusion.  Lung infection (pneumonia). This may cause fever, chills, cough, rapid breathing, difficulty breathing, or chest pain.  Bone, joint, skin, or soft tissue infection.  How is this diagnosed? You may be screened for GBS between week 35 and week 37 of your pregnancy. If  you have symptoms of preterm labor, you may be screened earlier. This condition is diagnosed based on lab test results from:  A swab of fluid from the vagina and rectum.  A urine sample.  How is this treated? This condition is treated with antibiotic medicine. When you go into labor, or as soon as your water breaks (your membranes rupture), you will be given antibiotics through an IV tube. Antibiotics will continue until after you give birth. If you are having a cesarean delivery, you do not need antibiotics unless your membranes have already ruptured. Follow these instructions at home:  Take over-the-counter and prescription medicines only as told by your health care provider.  Take your antibiotic medicine as told by your health care provider. Do not stop taking the antibiotic even if you start to feel better.  Keep all pre-birth (prenatal) visits and follow-up visits as told by your health care provider. This is important. Contact a health care provider if:  You have pain or burning when you urinate.  You have to urinate frequently.  You have a fever or chills.  You develop a bad-smelling vaginal discharge. Get help right away if:  Your membranes rupture.  You go into labor.  You have severe pain in your abdomen.  You have difficulty breathing.  You have chest pain. This information is not intended to replace advice given to you by your health care provider. Make sure you discuss any questions you have with your health care provider. Document Released: 12/16/2007 Document Revised: 04/04/2016 Document Reviewed: 04/03/2016 Elsevier Interactive Patient Education  2018 [employer]  

## 2017-08-26 ENCOUNTER — Encounter: Payer: Self-pay | Admitting: Certified Nurse Midwife

## 2017-08-26 LAB — GC/CHLAMYDIA PROBE AMP
Chlamydia trachomatis, NAA: NEGATIVE
Neisseria gonorrhoeae by PCR: NEGATIVE

## 2017-08-26 LAB — STREP GP B NAA: Strep Gp B NAA: NEGATIVE

## 2017-08-28 ENCOUNTER — Encounter: Payer: Self-pay | Admitting: *Deleted

## 2017-08-28 LAB — DRUG PROFILE, UR, 9 DRUGS (LABCORP)
Amphetamines, Urine: NEGATIVE ng/mL
Barbiturate Quant, Ur: NEGATIVE ng/mL
Benzodiazepine Quant, Ur: NEGATIVE ng/mL
COCAINE (METAB.): NEGATIVE ng/mL
Cannabinoid Quant, Ur: POSITIVE — AB
Methadone Screen, Urine: NEGATIVE ng/mL
OPIATE QUANT UR: NEGATIVE ng/mL
PCP QUANT UR: NEGATIVE ng/mL
PROPOXYPHENE: NEGATIVE ng/mL

## 2017-08-31 ENCOUNTER — Encounter: Payer: Medicaid Other | Admitting: Certified Nurse Midwife

## 2017-09-02 ENCOUNTER — Ambulatory Visit (INDEPENDENT_AMBULATORY_CARE_PROVIDER_SITE_OTHER): Payer: Medicaid Other | Admitting: Certified Nurse Midwife

## 2017-09-02 ENCOUNTER — Encounter: Payer: Self-pay | Admitting: Certified Nurse Midwife

## 2017-09-02 VITALS — BP 103/62 | HR 79 | Wt 166.0 lb

## 2017-09-02 DIAGNOSIS — Z3493 Encounter for supervision of normal pregnancy, unspecified, third trimester: Secondary | ICD-10-CM

## 2017-09-02 NOTE — Progress Notes (Signed)
ROB- pt is having a lot of pelvic pressure and low back pain

## 2017-09-02 NOTE — Progress Notes (Signed)
ROB, doing well. She is having irregular contractions. Labor precautions reviewed. Follow up 1 wk.   Philip Aspen, CNM

## 2017-09-02 NOTE — Patient Instructions (Signed)

## 2017-09-10 ENCOUNTER — Ambulatory Visit (INDEPENDENT_AMBULATORY_CARE_PROVIDER_SITE_OTHER): Payer: Medicaid Other | Admitting: Obstetrics and Gynecology

## 2017-09-10 VITALS — BP 103/68 | HR 82 | Wt 169.7 lb

## 2017-09-10 DIAGNOSIS — Z3493 Encounter for supervision of normal pregnancy, unspecified, third trimester: Secondary | ICD-10-CM

## 2017-09-10 LAB — POCT URINALYSIS DIPSTICK
BILIRUBIN UA: NEGATIVE
Glucose, UA: NEGATIVE
Ketones, UA: NEGATIVE
LEUKOCYTES UA: NEGATIVE
Nitrite, UA: NEGATIVE
Protein, UA: NEGATIVE
RBC UA: NEGATIVE
Spec Grav, UA: 1.01 (ref 1.010–1.025)
Urobilinogen, UA: 0.2 E.U./dL
pH, UA: 6 (ref 5.0–8.0)

## 2017-09-10 NOTE — Progress Notes (Signed)
ROB-doing good, increased [place] and lost mucus plug a week ago. LABOR PRECAUTIONS DISCUSSED.

## 2017-09-10 NOTE — Progress Notes (Signed)
ROB- pt is having some pelvic pressure, low back pain

## 2017-09-17 ENCOUNTER — Encounter: Payer: Self-pay | Admitting: Certified Nurse Midwife

## 2017-09-17 ENCOUNTER — Ambulatory Visit (INDEPENDENT_AMBULATORY_CARE_PROVIDER_SITE_OTHER): Payer: Medicaid Other | Admitting: Certified Nurse Midwife

## 2017-09-17 VITALS — BP 106/73 | HR 93 | Wt 167.6 lb

## 2017-09-17 DIAGNOSIS — Z3493 Encounter for supervision of normal pregnancy, unspecified, third trimester: Secondary | ICD-10-CM

## 2017-09-17 LAB — POCT URINALYSIS DIPSTICK
Bilirubin, UA: NEGATIVE
GLUCOSE UA: NEGATIVE
KETONES UA: NEGATIVE
NITRITE UA: NEGATIVE
PROTEIN UA: NEGATIVE
SPEC GRAV UA: 1.015 (ref 1.010–1.025)
Urobilinogen, UA: 0.2 E.U./dL
pH, UA: 6.5 (ref 5.0–8.0)

## 2017-09-17 NOTE — Progress Notes (Signed)
ROB- Pt is doing ok, just lots of pressure and back pain denies dysuria

## 2017-09-17 NOTE — Patient Instructions (Signed)
Vaginal Delivery Vaginal delivery means that you will give birth by pushing your baby out of your birth canal (vagina). A team of health care providers will help you before, during, and after vaginal delivery. Birth experiences are unique for every woman and every pregnancy, and birth experiences vary depending on where you choose to give birth. What should I do to prepare for my baby's birth? Before your baby is born, it is important to talk with your health care provider about:  Your labor and delivery preferences. These may include: ? Medicines that you may be given. ? How you will manage your pain. This might include non-medical pain relief techniques or injectable pain relief such as epidural analgesia. ? How you and your baby will be monitored during labor and delivery. ? Who may be in the labor and delivery room with you. ? Your feelings about surgical delivery of your baby (cesarean delivery, or C-section) if this becomes necessary. ? Your feelings about receiving donated blood through an IV tube (blood transfusion) if this becomes necessary.  Whether you are able: ? To take pictures or videos of the birth. ? To eat during labor and delivery. ? To move around, walk, or change positions during labor and delivery.  What to expect after your baby is born, such as: ? Whether delayed umbilical cord clamping and cutting is offered. ? Who will care for your baby right after birth. ? Medicines or tests that may be recommended for your baby. ? Whether breastfeeding is supported in your hospital or birth center. ? How long you will be in the hospital or birth center.  How any medical conditions you have may affect your baby or your labor and delivery experience.  To prepare for your baby's birth, you should also:  Attend all of your health care visits before delivery (prenatal visits) as recommended by your health care provider. This is important.  Prepare your home for your baby's  arrival. Make sure that you have: ? Diapers. ? Baby clothing. ? Feeding equipment. ? Safe sleeping arrangements for you and your baby.  Install a car seat in your vehicle. Have your car seat checked by a certified car seat installer to make sure that it is installed safely.  Think about who will help you with your new baby at home for at least the first several weeks after delivery.  What can I expect when I arrive at the birth center or hospital? Once you are in labor and have been admitted into the hospital or birth center, your health care provider may:  Review your pregnancy history and any concerns you have.  Insert an IV tube into one of your veins. This is used to give you fluids and medicines.  Check your blood pressure, pulse, temperature, and heart rate (vital signs).  Check whether your bag of water (amniotic sac) has broken (ruptured).  Talk with you about your birth plan and discuss pain control options.  Monitoring Your health care provider may monitor your contractions (uterine monitoring) and your baby's heart rate (fetal monitoring). You may need to be monitored:  Often, but not continuously (intermittently).  All the time or for long periods at a time (continuously). Continuous monitoring may be needed if: ? You are taking certain medicines, such as medicine to relieve pain or make your contractions stronger. ? You have pregnancy or labor complications.  Monitoring may be done by:  Placing a special stethoscope or a handheld monitoring device on your abdomen to   check your baby's heartbeat, and feeling your abdomen for contractions. This method of monitoring does not continuously record your baby's heartbeat or your contractions.  Placing monitors on your abdomen (external monitors) to record your baby's heartbeat and the frequency and length of contractions. You may not have to wear external monitors all the time.  Placing monitors inside of your uterus  (internal monitors) to record your baby's heartbeat and the frequency, length, and strength of your contractions. ? Your health care provider may use internal monitors if he or she needs more information about the strength of your contractions or your baby's heart rate. ? Internal monitors are put in place by passing a thin, flexible wire through your vagina and into your uterus. Depending on the type of monitor, it may remain in your uterus or on your baby's head until birth. ? Your health care provider will discuss the benefits and risks of internal monitoring with you and will ask for your permission before inserting the monitors.  Telemetry. This is a type of continuous monitoring that can be done with external or internal monitors. Instead of having to stay in bed, you are able to move around during telemetry. Ask your health care provider if telemetry is an option for you.  Physical exam Your health care provider may perform a physical exam. This may include:  Checking whether your baby is positioned: ? With the head toward your vagina (head-down). This is most common. ? With the head toward the top of your uterus (head-up or breech). If your baby is in a breech position, your health care provider may try to turn your baby to a head-down position so you can deliver vaginally. If it does not seem that your baby can be born vaginally, your provider may recommend surgery to deliver your baby. In rare cases, you may be able to deliver vaginally if your baby is head-up (breech delivery). ? Lying sideways (transverse). Babies that are lying sideways cannot be delivered vaginally.  Checking your cervix to determine: ? Whether it is thinning out (effacing). ? Whether it is opening up (dilating). ? How low your baby has moved into your birth canal.  What are the three stages of labor and delivery?  Normal labor and delivery is divided into the following three stages: Stage 1  Stage 1 is the  longest stage of labor, and it can last for hours or days. Stage 1 includes: ? Early labor. This is when contractions may be irregular, or regular and mild. Generally, early labor contractions are more than 10 minutes apart. ? Active labor. This is when contractions get longer, more regular, more frequent, and more intense. ? The transition phase. This is when contractions happen very close together, are very intense, and may last longer than during any other part of labor.  Contractions generally feel mild, infrequent, and irregular at first. They get stronger, more frequent (about every 2-3 minutes), and more regular as you progress from early labor through active labor and transition.  Many women progress through stage 1 naturally, but you may need help to continue making progress. If this happens, your health care provider may talk with you about: ? Rupturing your amniotic sac if it has not ruptured yet. ? Giving you medicine to help make your contractions stronger and more frequent.  Stage 1 ends when your cervix is completely dilated to 4 inches (10 cm) and completely effaced. This happens at the end of the transition phase. Stage 2  Once   your cervix is completely effaced and dilated to 4 inches (10 cm), you may start to feel an urge to push. It is common for the body to naturally take a rest before feeling the urge to push, especially if you received an epidural or certain other pain medicines. This rest period may last for up to 1-2 hours, depending on your unique labor experience.  During stage 2, contractions are generally less painful, because pushing helps relieve contraction pain. Instead of contraction pain, you may feel stretching and burning pain, especially when the widest part of your baby's head passes through the vaginal opening (crowning).  Your health care provider will closely monitor your pushing progress and your baby's progress through the vagina during stage 2.  Your  health care provider may massage the area of skin between your vaginal opening and anus (perineum) or apply warm compresses to your perineum. This helps it stretch as the baby's head starts to crown, which can help prevent perineal tearing. ? In some cases, an incision may be made in your perineum (episiotomy) to allow the baby to pass through the vaginal opening. An episiotomy helps to make the opening of the vagina larger to allow more room for the baby to fit through.  It is very important to breathe and focus so your health care provider can control the delivery of your baby's head. Your health care provider may have you decrease the intensity of your pushing, to help prevent perineal tearing.  After delivery of your baby's head, the shoulders and the rest of the body generally deliver very quickly and without difficulty.  Once your baby is delivered, the umbilical cord may be cut right away, or this may be delayed for 1-2 minutes, depending on your baby's health. This may vary among health care providers, hospitals, and birth centers.  If you and your baby are healthy enough, your baby may be placed on your chest or abdomen to help maintain the baby's temperature and to help you bond with each other. Some mothers and babies start breastfeeding at this time. Your health care team will dry your baby and help keep your baby warm during this time.  Your baby may need immediate care if he or she: ? Showed signs of distress during labor. ? Has a medical condition. ? Was born too early (prematurely). ? Had a bowel movement before birth (meconium). ? Shows signs of difficulty transitioning from being inside the uterus to being outside of the uterus. If you are planning to breastfeed, your health care team will help you begin a feeding. Stage 3  The third stage of labor starts immediately after the birth of your baby and ends after you deliver the placenta. The placenta is an organ that develops  during pregnancy to provide oxygen and nutrients to your baby in the womb.  Delivering the placenta may require some pushing, and you may have mild contractions. Breastfeeding can stimulate contractions to help you deliver the placenta.  After the placenta is delivered, your uterus should tighten (contract) and become firm. This helps to stop bleeding in your uterus. To help your uterus contract and to control bleeding, your health care provider may: ? Give you medicine by injection, through an IV tube, by mouth, or through your rectum (rectally). ? Massage your abdomen or perform a vaginal exam to remove any blood clots that are left in your uterus. ? Empty your bladder by placing a thin, flexible tube (catheter) into your bladder. ? Encourage   you to breastfeed your baby. After labor is over, you and your baby will be monitored closely to ensure that you are both healthy until you are ready to go home. Your health care team will teach you how to care for yourself and your baby. This information is not intended to replace advice given to you by your health care provider. Make sure you discuss any questions you have with your health care provider. Document Released: 06/17/2008 Document Revised: 03/28/2016 Document Reviewed: 09/23/2015 Elsevier Interactive Patient Education  2018 [employer]vier Inc.  

## 2017-09-19 ENCOUNTER — Other Ambulatory Visit: Payer: Self-pay

## 2017-09-19 ENCOUNTER — Inpatient Hospital Stay
Admission: EM | Admit: 2017-09-19 | Discharge: 2017-09-21 | DRG: 807 | Disposition: A | Discharge status: Home / Self Care | Payer: Medicaid Other | Attending: Certified Nurse Midwife | Admitting: Certified Nurse Midwife

## 2017-09-19 ENCOUNTER — Inpatient Hospital Stay: Payer: Medicaid Other | Admitting: Anesthesiology

## 2017-09-19 ENCOUNTER — Encounter: Payer: Self-pay | Admitting: *Deleted

## 2017-09-19 DIAGNOSIS — Z3A39 39 weeks gestation of pregnancy: Secondary | ICD-10-CM

## 2017-09-19 DIAGNOSIS — O4292 Full-term premature rupture of membranes, unspecified as to length of time between rupture and onset of labor: Secondary | ICD-10-CM | POA: Diagnosis present

## 2017-09-19 DIAGNOSIS — O99019 Anemia complicating pregnancy, unspecified trimester: Secondary | ICD-10-CM | POA: Diagnosis present

## 2017-09-19 DIAGNOSIS — Z3A40 40 weeks gestation of pregnancy: Secondary | ICD-10-CM | POA: Diagnosis not present

## 2017-09-19 DIAGNOSIS — O9081 Anemia of the puerperium: Secondary | ICD-10-CM | POA: Diagnosis not present

## 2017-09-19 DIAGNOSIS — Z87891 Personal history of nicotine dependence: Secondary | ICD-10-CM

## 2017-09-19 DIAGNOSIS — O42113 Preterm premature rupture of membranes, onset of labor more than 24 hours following rupture, third trimester: Secondary | ICD-10-CM | POA: Diagnosis not present

## 2017-09-19 DIAGNOSIS — Z862 Personal history of diseases of the blood and blood-forming organs and certain disorders involving the immune mechanism: Secondary | ICD-10-CM

## 2017-09-19 DIAGNOSIS — Z8759 Personal history of other complications of pregnancy, childbirth and the puerperium: Secondary | ICD-10-CM

## 2017-09-19 LAB — URINE DRUG SCREEN, QUALITATIVE (ARMC ONLY)
AMPHETAMINES, UR SCREEN: NOT DETECTED
Barbiturates, Ur Screen: NOT DETECTED
Benzodiazepine, Ur Scrn: NOT DETECTED
CANNABINOID 50 NG, UR ~~LOC~~: NOT DETECTED
COCAINE METABOLITE, UR ~~LOC~~: NOT DETECTED
MDMA (ECSTASY) UR SCREEN: NOT DETECTED
Methadone Scn, Ur: NOT DETECTED
Opiate, Ur Screen: NOT DETECTED
PHENCYCLIDINE (PCP) UR S: NOT DETECTED
Tricyclic, Ur Screen: NOT DETECTED

## 2017-09-19 LAB — CBC
HCT: 31.9 % — ABNORMAL LOW (ref 35.0–47.0)
Hemoglobin: 10.6 g/dL — ABNORMAL LOW (ref 12.0–16.0)
MCH: 28.7 pg (ref 26.0–34.0)
MCHC: 33.3 g/dL (ref 32.0–36.0)
MCV: 86.1 fL (ref 80.0–100.0)
PLATELETS: 238 10*3/uL (ref 150–440)
RBC: 3.7 MIL/uL — ABNORMAL LOW (ref 3.80–5.20)
RDW: 14.1 % (ref 11.5–14.5)
WBC: 10.7 10*3/uL (ref 3.6–11.0)

## 2017-09-19 LAB — TYPE AND SCREEN
ABO/RH(D): O POS
Antibody Screen: NEGATIVE

## 2017-09-19 MED ORDER — PHENYLEPHRINE 40 MCG/ML (10ML) SYRINGE FOR IV PUSH (FOR BLOOD PRESSURE SUPPORT)
80.0000 ug | PREFILLED_SYRINGE | INTRAVENOUS | Status: DC | PRN
  Filled 2017-09-19: qty 5

## 2017-09-19 MED ORDER — PHENYLEPHRINE 40 MCG/ML (10ML) SYRINGE FOR IV PUSH (FOR BLOOD PRESSURE SUPPORT)
80.0000 ug | PREFILLED_SYRINGE | INTRAVENOUS | Status: DC | PRN
  Administered 2017-09-20: 80 ug via INTRAVENOUS
  Filled 2017-09-19: qty 5
  Filled 2017-09-19: qty 10

## 2017-09-19 MED ORDER — FLEET ENEMA 7-19 GM/118ML RE ENEM: 1.0000 | ENEMA | Freq: Every day | RECTAL | Status: DC | PRN

## 2017-09-19 MED ORDER — LACTATED RINGERS IV SOLN: 500.0000 mL | INTRAVENOUS | Status: DC | PRN

## 2017-09-19 MED ORDER — EPHEDRINE 5 MG/ML INJ
10.0000 mg | INTRAVENOUS | Status: AC | PRN
  Administered 2017-09-20 (×2): 10 mg via INTRAVENOUS
  Filled 2017-09-19: qty 4

## 2017-09-19 MED ORDER — SOD CITRATE-CITRIC ACID 500-334 MG/5ML PO SOLN: 30.0000 mL | ORAL | Status: DC | PRN

## 2017-09-19 MED ORDER — LACTATED RINGERS IV SOLN
INTRAVENOUS | Status: DC
  Administered 2017-09-19: 18:00:00 via INTRAVENOUS

## 2017-09-19 MED ORDER — ZOLPIDEM TARTRATE 5 MG PO TABS: 5.0000 mg | ORAL_TABLET | Freq: Every evening | ORAL | Status: DC | PRN

## 2017-09-19 MED ORDER — FENTANYL 2.5 MCG/ML W/ROPIVACAINE 0.15% IN NS 100 ML EPIDURAL (ARMC)
12.0000 mL/h | EPIDURAL | Status: DC
  Administered 2017-09-19: 12 mL/h via EPIDURAL
  Filled 2017-09-19: qty 100

## 2017-09-19 MED ORDER — TERBUTALINE SULFATE 1 MG/ML IJ SOLN: 0.2500 mg | Freq: Once | INTRAMUSCULAR | Status: DC | PRN

## 2017-09-19 MED ORDER — BUPIVACAINE HCL (PF) 0.25 % IJ SOLN
INTRAMUSCULAR | Status: DC | PRN
  Administered 2017-09-19 (×2): 4 mL via EPIDURAL

## 2017-09-19 MED ORDER — LIDOCAINE HCL (PF) 1 % IJ SOLN
30.0000 mL | INTRAMUSCULAR | Status: AC | PRN
  Administered 2017-09-19: 3 mL via SUBCUTANEOUS

## 2017-09-19 MED ORDER — FENTANYL 2.5 MCG/ML W/ROPIVACAINE 0.15% IN NS 100 ML EPIDURAL (ARMC)
EPIDURAL | Status: AC
  Filled 2017-09-19: qty 100

## 2017-09-19 MED ORDER — OXYCODONE-ACETAMINOPHEN 5-325 MG PO TABS: 2.0000 | ORAL_TABLET | ORAL | Status: DC | PRN

## 2017-09-19 MED ORDER — BUTORPHANOL TARTRATE 1 MG/ML IJ SOLN: 1.0000 mg | INTRAMUSCULAR | Status: DC | PRN

## 2017-09-19 MED ORDER — DIPHENHYDRAMINE HCL 50 MG/ML IJ SOLN: 12.5000 mg | INTRAMUSCULAR | Status: DC | PRN

## 2017-09-19 MED ORDER — LACTATED RINGERS IV SOLN: 500.0000 mL | Freq: Once | INTRAVENOUS | Status: DC

## 2017-09-19 MED ORDER — OXYCODONE-ACETAMINOPHEN 5-325 MG PO TABS: 1.0000 | ORAL_TABLET | ORAL | Status: DC | PRN

## 2017-09-19 MED ORDER — OXYTOCIN BOLUS FROM INFUSION
500.0000 mL | Freq: Once | INTRAVENOUS | Status: AC
  Administered 2017-09-20: 500 mL via INTRAVENOUS

## 2017-09-19 MED ORDER — OXYTOCIN 40 UNITS IN LACTATED RINGERS INFUSION - SIMPLE MED
1.0000 m[IU]/min | INTRAVENOUS | Status: DC
  Administered 2017-09-19: 1 m[IU]/min via INTRAVENOUS
  Filled 2017-09-19: qty 1000

## 2017-09-19 MED ORDER — EPHEDRINE 5 MG/ML INJ
10.0000 mg | INTRAVENOUS | Status: DC | PRN
  Administered 2017-09-20: 10 mg via INTRAVENOUS
  Filled 2017-09-19: qty 2
  Filled 2017-09-19: qty 4

## 2017-09-19 MED ORDER — LIDOCAINE-EPINEPHRINE (PF) 1.5 %-1:200000 IJ SOLN
INTRAMUSCULAR | Status: DC | PRN
  Administered 2017-09-19: 4 mL via EPIDURAL

## 2017-09-19 MED ORDER — ONDANSETRON HCL 4 MG/2ML IJ SOLN: 4.0000 mg | Freq: Four times a day (QID) | INTRAMUSCULAR | Status: DC | PRN

## 2017-09-19 MED ORDER — ACETAMINOPHEN 325 MG PO TABS: 650.0000 mg | ORAL_TABLET | ORAL | Status: DC | PRN

## 2017-09-19 MED ORDER — OXYTOCIN 40 UNITS IN LACTATED RINGERS INFUSION - SIMPLE MED: 2.5000 [IU]/h | INTRAVENOUS | Status: DC

## 2017-09-19 NOTE — Anesthesia Procedure Notes (Signed)
Epidural Patient location during procedure: OB  Staffing Performed: anesthesiologist   Preanesthetic Checklist Completed: patient identified, site marked, surgical consent, pre-op evaluation, timeout performed, IV checked, risks and benefits discussed and monitors and equipment checked  Epidural Patient position: sitting Prep: Betadine Patient monitoring: heart rate, continuous pulse ox and blood pressure Approach: midline Location: L4-L5 Injection technique: LOR saline  Needle:  Needle type: Tuohy  Needle gauge: 18 G Needle length: 9 cm and 9 Needle insertion depth: 7 cm Catheter type: closed end flexible Catheter size: 20 Guage Catheter at skin depth: 13 cm Test dose: negative and 1.5% lidocaine with Epi 1:200 K  Assessment Sensory level: T10 Events: blood not aspirated, injection not painful, no injection resistance, negative IV test and no paresthesia  Additional Notes   Patient tolerated the insertion well without complications.-SATD -IVTD. No paresthesia. Refer to [facility] for VS and dosingReason for block:procedure for pain

## 2017-09-19 NOTE — OB Triage Note (Signed)
Patient states that she began leaking clear fluid around 12pm today. She states that she has felt vaginal pressure.

## 2017-09-19 NOTE — H&P (Signed)
Obstetric History and Physical  Melanie York is a 26 y.o. [phone] with IUP at [redacted]w[redacted]d presenting with spontaneous rupture of membranes at 1200. Patient states she has been having  none contractions, none vaginal bleeding, clear fluid membranes, with active fetal movement.    Denies difficulty breathing or respiratory distress, chest pain, abdominal pain, dysuria, and leg pain or swelling.   Prenatal Course  Source of Care: [facility]-initial visit at 12 weeks, 11 total visits  Pregnancy complications or risks: history of positive drug screen, history of postpartum hemorrhage, history of preterm labor  Prenatal labs and studies:  ABO, Rh: O/Positive/-- 03-14-23 1130)  Antibody: Negative 2023-03-14 1130)  Rubella: 5.82 03-14-2023 1130)  Varicella: 1536 03/14/23 1130)  RPR: Non Reactive 03-14-2023 1130)   HBsAg: Negative 03/14/2023 1130)   HIV: Non Reactive 2023/03/14 1130)   GLO:VFIEPPIR (12/03 1517)  1 hr Glucola: 124 (10/12 0921)  Genetic screening: low risk female 03/14/23 1130)  Anatomy US: Normal (08/15 1354)  History reviewed. No pertinent past medical history.  Past Surgical History:  Procedure Laterality Date  . TONSILLECTOMY      OB History  Gravida Para Term Preterm AB Living  4 3 3     3   SAB TAB Ectopic Multiple Live Births          3    # Outcome Date GA Lbr Len/2nd Weight Sex Delivery Anes PTL Lv  4 Current           3 Term 05/20/13 [redacted]w[redacted]d  6 lb 1.6 oz (2.767 kg) F Vag-Spont   LIV  2 Term 09/21/09 [redacted]w[redacted]d  6 lb 2.2 oz (2.785 kg) M Vag-Spont   LIV  1 Term 11/24/07 [redacted]w[redacted]d  7 lb 1.6 oz (3.221 kg) F Vag-Spont   LIV      Social History   Socioeconomic History  . Marital status: In a relationship    Spouse name: None  . Number of children: None  . Years of education: None  . Highest education level: None  Social Needs  . Financial resource strain: None  . Food insecurity - worry: None  . Food insecurity - inability: None  . Transportation needs - medical: None  .  Transportation needs - non-medical: None  Occupational History  . None  Tobacco Use  . Smoking status: Former Smoker    Packs/day: 0.25  . Smokeless tobacco: Never Used  Substance and Sexual Activity  . Alcohol use: Yes    Comment: occ. before pregnancy  . Drug use: No  . Sexual activity: Yes    Partners: Male    Birth control/protection: None  Other Topics Concern  . None  Social History Narrative  . None    Family History  Problem Relation Age of Onset  . Diabetes Mother   . Cancer Mother        cervical    Medications Prior to Admission  Medication Sig Dispense Refill Last Dose  . Prenat-FeFum-FePo-FA-Omega 3 (CONCEPT DHA) 53.5-38-1 MG CAPS Take 1 capsule by mouth daily. 30 capsule 11 Past Week at Unknown time    Allergies  Allergen Reactions  . Naproxen Rash    Review of Systems: Negative except for what is mentioned in HPI.  Physical Exam:  BP 103/75   Pulse 92   Temp 98.2 F (36.8 C) (Oral)   Resp 14   Ht 5\' 4"  (1.626 m)   Wt 167 lb (75.8 kg)   LMP 12/05/2016 (Exact Date)   BMI 28.67 kg/m  GENERAL: Well-developed, well-nourished female in no acute distress.   LUNGS: Clear to auscultation bilaterally.   HEART: Regular rate and rhythm.  ABDOMEN: Soft, nontender, nondistended, gravid.  EXTREMITIES: Nontender, no edema, 2+ distal pulses.  Cervical Exam: Dilation: 4.5 Effacement (%): 60, 50 Station: -3 Exam by:: [email or web address]  FHT:  Baseline rate 135 bpm   Variability moderate  Accelerations present   Decelerations none  Contractions: Occasional, soft resting tone   Pertinent Labs/Studies:    No results found for this or any previous visit (from the past 24 hour(s)).  Assessment :  Melanie York is a 26 y.o. [phone] at [redacted]w[redacted]d being admitted for term premature rupture of membranes, Rh positive, GBS negative  FHR Category I  Plan:  Admit to birthing suites, see orders.   Induction/Augmentation as needed, per protocol.  Will have  cytotec, methergine, and Hemabate at bedside in anticipation of third stage.   Reviewed red flag symptoms and when to call.   Delivery plan: Hopeful for vaginal delivery  Dr. Marcelline Mates notified of admission and plan or care.    Diona Fanti, CNM [facility], [facility]

## 2017-09-19 NOTE — Anesthesia Preprocedure Evaluation (Signed)
Anesthesia Evaluation  Patient identified by MRN, date of birth, ID band Patient awake    Reviewed: Allergy & Precautions, H&P , NPO status , Patient's Chart, lab work & pertinent test results, reviewed documented beta blocker date and time   Airway Mallampati: II  TM Distance: >3 FB Neck ROM: full    Dental no notable dental hx. (+) Teeth Intact   Pulmonary neg pulmonary ROS, Current Smoker, former smoker,    Pulmonary exam normal breath sounds clear to auscultation       Cardiovascular Exercise Tolerance: Good negative cardio ROS   Rhythm:regular Rate:Normal     Neuro/Psych negative neurological ROS  negative psych ROS   GI/Hepatic negative GI ROS, Neg liver ROS,   Endo/Other  negative endocrine ROSdiabetes  Renal/GU      Musculoskeletal   Abdominal   Peds  Hematology negative hematology ROS (+) anemia ,   Anesthesia Other Findings   Reproductive/Obstetrics (+) Pregnancy                             Anesthesia Physical Anesthesia Plan  ASA: II  Anesthesia Plan: Epidural   Post-op Pain Management:    Induction:   PONV Risk Score and Plan:   Airway Management Planned:   Additional Equipment:   Intra-op Plan:   Post-operative Plan:   Informed Consent: I have reviewed the patients History and Physical, chart, labs and discussed the procedure including the risks, benefits and alternatives for the proposed anesthesia with the patient or authorized representative who has indicated his/her understanding and acceptance.     Plan Discussed with:   Anesthesia Plan Comments:         Anesthesia Quick Evaluation

## 2017-09-20 DIAGNOSIS — O42113 Preterm premature rupture of membranes, onset of labor more than 24 hours following rupture, third trimester: Secondary | ICD-10-CM

## 2017-09-20 DIAGNOSIS — Z3A40 40 weeks gestation of pregnancy: Secondary | ICD-10-CM

## 2017-09-20 LAB — CBC
HEMATOCRIT: 31.3 % — AB (ref 35.0–47.0)
HEMOGLOBIN: 10.1 g/dL — AB (ref 12.0–16.0)
MCH: 27.8 pg (ref 26.0–34.0)
MCHC: 32.3 g/dL (ref 32.0–36.0)
MCV: 86 fL (ref 80.0–100.0)
Platelets: 207 10*3/uL (ref 150–440)
RBC: 3.64 MIL/uL — AB (ref 3.80–5.20)
RDW: 14 % (ref 11.5–14.5)
WBC: 15.9 10*3/uL — AB (ref 3.6–11.0)

## 2017-09-20 MED ORDER — ZOLPIDEM TARTRATE 5 MG PO TABS: 5.0000 mg | ORAL_TABLET | Freq: Every evening | ORAL | Status: DC | PRN

## 2017-09-20 MED ORDER — OXYTOCIN 40 UNITS IN LACTATED RINGERS INFUSION - SIMPLE MED
INTRAVENOUS | Status: AC
  Administered 2017-09-20: 10:00:00
  Filled 2017-09-20: qty 1000

## 2017-09-20 MED ORDER — SENNOSIDES-DOCUSATE SODIUM 8.6-50 MG PO TABS
2.0000 | ORAL_TABLET | ORAL | Status: DC
  Administered 2017-09-20: 2 via ORAL
  Filled 2017-09-20: qty 2

## 2017-09-20 MED ORDER — ONDANSETRON HCL 4 MG/2ML IJ SOLN: 4.0000 mg | INTRAMUSCULAR | Status: DC | PRN

## 2017-09-20 MED ORDER — WITCH HAZEL-GLYCERIN EX PADS
1.0000 "application " | MEDICATED_PAD | CUTANEOUS | Status: DC | PRN
  Filled 2017-09-20: qty 100

## 2017-09-20 MED ORDER — OXYTOCIN 10 UNIT/ML IJ SOLN
INTRAMUSCULAR | Status: AC
  Filled 2017-09-20: qty 2

## 2017-09-20 MED ORDER — METHYLERGONOVINE MALEATE 0.2 MG PO TABS: 0.2000 mg | ORAL_TABLET | ORAL | Status: DC | PRN

## 2017-09-20 MED ORDER — FERROUS SULFATE 325 (65 FE) MG PO TABS
325.0000 mg | ORAL_TABLET | Freq: Three times a day (TID) | ORAL | Status: DC
  Administered 2017-09-20 – 2017-09-21 (×3): 325 mg via ORAL
  Filled 2017-09-20 (×3): qty 1

## 2017-09-20 MED ORDER — IBUPROFEN 600 MG PO TABS
ORAL_TABLET | ORAL | Status: AC
Start: 2017-09-20 — End: 2017-09-20
  Administered 2017-09-20: 600 mg via ORAL
  Filled 2017-09-20: qty 1

## 2017-09-20 MED ORDER — ACETAMINOPHEN 325 MG PO TABS
650.0000 mg | ORAL_TABLET | ORAL | Status: DC | PRN
  Administered 2017-09-20 (×2): 650 mg via ORAL
  Filled 2017-09-20 (×3): qty 2

## 2017-09-20 MED ORDER — BENZOCAINE-MENTHOL 20-0.5 % EX AERO
1.0000 "application " | INHALATION_SPRAY | CUTANEOUS | Status: DC | PRN
  Filled 2017-09-20: qty 56

## 2017-09-20 MED ORDER — METHYLERGONOVINE MALEATE 0.2 MG/ML IJ SOLN
INTRAMUSCULAR | Status: AC
  Filled 2017-09-20: qty 1

## 2017-09-20 MED ORDER — ONDANSETRON HCL 4 MG PO TABS: 4.0000 mg | ORAL_TABLET | ORAL | Status: DC | PRN

## 2017-09-20 MED ORDER — MEDROXYPROGESTERONE ACETATE 150 MG/ML IM SUSP
150.0000 mg | INTRAMUSCULAR | Status: AC | PRN
  Administered 2017-09-21: 150 mg via INTRAMUSCULAR
  Filled 2017-09-20: qty 1

## 2017-09-20 MED ORDER — DIPHENHYDRAMINE HCL 25 MG PO CAPS: 25.0000 mg | ORAL_CAPSULE | Freq: Four times a day (QID) | ORAL | Status: DC | PRN

## 2017-09-20 MED ORDER — DIBUCAINE 1 % RE OINT: 1.0000 "application " | TOPICAL_OINTMENT | RECTAL | Status: DC | PRN

## 2017-09-20 MED ORDER — SIMETHICONE 80 MG PO CHEW: 80.0000 mg | CHEWABLE_TABLET | ORAL | Status: DC | PRN

## 2017-09-20 MED ORDER — COCONUT OIL OIL: 1.0000 "application " | TOPICAL_OIL | Status: DC | PRN

## 2017-09-20 MED ORDER — PRENATAL MULTIVITAMIN CH
1.0000 | ORAL_TABLET | Freq: Every day | ORAL | Status: DC
  Administered 2017-09-20: 1 via ORAL
  Filled 2017-09-20: qty 1

## 2017-09-20 MED ORDER — METHYLERGONOVINE MALEATE 0.2 MG/ML IJ SOLN: 0.2000 mg | INTRAMUSCULAR | Status: DC | PRN

## 2017-09-20 MED ORDER — IBUPROFEN 600 MG PO TABS
600.0000 mg | ORAL_TABLET | Freq: Four times a day (QID) | ORAL | Status: DC
  Administered 2017-09-20 – 2017-09-21 (×5): 600 mg via ORAL
  Filled 2017-09-20 (×4): qty 1

## 2017-09-20 MED ORDER — CARBOPROST TROMETHAMINE 250 MCG/ML IM SOLN
250.0000 ug | INTRAMUSCULAR | Status: DC | PRN
Start: 2017-09-20 — End: 2017-09-21

## 2017-09-20 MED ORDER — CARBOPROST TROMETHAMINE 250 MCG/ML IM SOLN
INTRAMUSCULAR | Status: AC
  Filled 2017-09-20: qty 1

## 2017-09-20 MED ORDER — AMMONIA AROMATIC IN INHA
RESPIRATORY_TRACT | Status: AC
  Filled 2017-09-20: qty 10

## 2017-09-20 MED ORDER — LIDOCAINE HCL (PF) 1 % IJ SOLN
INTRAMUSCULAR | Status: AC
  Filled 2017-09-20: qty 30

## 2017-09-20 MED ORDER — MISOPROSTOL 200 MCG PO TABS
ORAL_TABLET | ORAL | Status: AC
  Administered 2017-09-20: 800 ug
  Filled 2017-09-20: qty 4

## 2017-09-20 NOTE — Progress Notes (Signed)
Patient ID: Melanie York, female   DOB: 10-31-1990, 26 y.o.   MRN: [id number]  Post Partum Day # 1/2, s/p SVD  Subjective:    Objective:  Temp:  [97.8 F (36.6 C)-98.6 F (37 C)] 98.6 F (37 C) (12/30 0701) Pulse Rate:  [63-144] 77 (12/30 0701) Resp:  [14-19] 19 (12/29 2324) BP: (81-135)/(42-94) 108/57 (12/30 0701) SpO2:  [98 %-100 %] 98 % (12/30 0701) Weight:  [167 lb (75.8 kg)] 167 lb (75.8 kg) (12/29 1502)  Physical Exam:   General: alert and cooperative   Lungs: clear to auscultation bilaterally  Breasts: normal appearance, no masses or tenderness  Heart: regular rate and rhythm, S1, S2 normal, no murmur, click, rub or gallop  Abdomen: soft, non-tender; bowel sounds normal; no masses,  no organomegaly  Pelvis: Lochia: appropriate, Uterine Fundus: firm  Extremities: DVT Evaluation: No evidence of DVT seen on physical exam.  Recent Labs    09/19/17 1705 09/20/17 0729  HGB 10.6* 10.1*  HCT 31.9* 31.3*    Assessment/Plan:  Postpartum anemia-iron supplementation, Formula feeding, Contraception-Depo Provera, Plan discharge tomorrow   LOS: 1 day    Diona Fanti, CNM [facility] 09/20/2017 9:46 AM

## 2017-09-20 NOTE — Progress Notes (Signed)
Patient ID: Melanie York, female   DOB: October 21, 1990, 26 y.o.   MRN: [id number]   Melanie York is a 26 y.o. G4P3003 at [redacted]w[redacted]d by ultrasound admitted for rupture of membranes  Subjective:  Pt lying in bed, reports pelvic pressure. No questions or concerns.   Denies difficulty breathing or respiratory distress, chest pain, abdominal pain, and leg pain or swelling.   Objective:  Temp:  [97.8 F (36.6 C)-98.2 F (36.8 C)] 97.8 F (36.6 C) (12/29 2345) Pulse Rate:  [84-122] 108 (12/30 0002) Resp:  [14-19] 19 (12/29 2324) BP: (81-135)/(43-94) 81/49 (12/30 0002) SpO2:  [99 %-100 %] 100 % (12/30 0002) Weight:  [167 lb (75.8 kg)] 167 lb (75.8 kg) (12/29 1502)  Fetal Wellbeing:  Category I  UC:   regular, every two (2) to four (4) minutes, soft resting tone; pitocin at 11 mu/min  SVE:   Dilation: 6.5 Effacement (%): 70 Station: -1 Exam by:: Diego Cory, CNM  Labs:  Lab Results  Component Value Date   WBC 10.7 09/19/2017   HGB 10.6 (L) 09/19/2017   HCT 31.9 (L) 09/19/2017   MCV 86.1 09/19/2017   PLT 238 09/19/2017    Assessment:  Melanie York is a 26 y.o. G4P3003 at [redacted]w[redacted]d being admitted for term premature rupture of membranes, Rh positive, GBS negative, pitocin augmentation  FHR Category I  Plan:  Continue orders as written.   Reassess as needed.    Diona Fanti, CNM 09/20/2017, 12:06 AM

## 2017-09-20 NOTE — Plan of Care (Signed)
Vs stable; up ad lib now; taking tylenol and motrin for pain control

## 2017-09-20 NOTE — Progress Notes (Signed)
Patient ID: Melanie York, female   DOB: 11-29-1990, 26 y.o.   MRN: [id number]  Melanie York is a 26 y.o. G4P3003 at [redacted]w[redacted]d by ultrasound admitted for rupture of membranes  Subjective:  Pt resting in bed. Reports relief of pain.   Denies difficulty breathing or respiratory distress, chest pain, abdominal pain, vaginal bleeding, dysuria, and leg pain or swelling.   Objective:  Temp:  [97.8 F (36.6 C)-98.2 F (36.8 C)] 97.8 F (36.6 C) (12/29 2345) Pulse Rate:  [74-122] 74 (12/30 0054) Resp:  [14-19] 19 (12/29 2324) BP: (81-135)/(42-94) 87/46 (12/30 0054) SpO2:  [98 %-100 %] 98 % (12/30 0054) Weight:  [167 lb (75.8 kg)] 167 lb (75.8 kg) (12/29 1502)  FHT:  FHR: 130 bpm, variability: moderate,  accelerations:  Present,  decelerations:  Present late  UC:   regular, every two (2) to five (5) minutes, soft resting tone; pitocin at 15 mu/min  SVE:   Dilation: 6.5 Effacement (%): 70 Station: -1 Exam by:: Melanie York, CNM  Labs: Lab Results  Component Value Date   WBC 10.7 09/19/2017   HGB 10.6 (L) 09/19/2017   HCT 31.9 (L) 09/19/2017   MCV 86.1 09/19/2017   PLT 238 09/19/2017    Assessment  Melanie York a 26 y.K.J1P9150 at [redacted]w[redacted]d admitted for term premature rupture of membranes, Rh positive, GBS negative, pitocin augmentation  FHR Category II  Plan:  Decelerations epidural related. RN to contact Anesthesia for additional orders.   IUPC placed without difficulty, will consider amnioinfusion if decelerations continue.   Continue orders as written. Reassess as needed.    Diona Fanti, CNM 09/20/2017, 1:43 AM

## 2017-09-21 LAB — CBC
HEMATOCRIT: 31.5 % — AB (ref 35.0–47.0)
HEMOGLOBIN: 10.6 g/dL — AB (ref 12.0–16.0)
MCH: 29.1 pg (ref 26.0–34.0)
MCHC: 33.5 g/dL (ref 32.0–36.0)
MCV: 86.9 fL (ref 80.0–100.0)
Platelets: 197 10*3/uL (ref 150–440)
RBC: 3.62 MIL/uL — AB (ref 3.80–5.20)
RDW: 14.2 % (ref 11.5–14.5)
WBC: 8.9 10*3/uL (ref 3.6–11.0)

## 2017-09-21 LAB — RPR: RPR: NONREACTIVE

## 2017-09-21 MED ORDER — FERROUS SULFATE 325 (65 FE) MG PO TABS: 325.0000 mg | ORAL_TABLET | Freq: Three times a day (TID) | ORAL | 3 refills | Status: DC

## 2017-09-21 MED ORDER — IBUPROFEN 600 MG PO TABS: 600.0000 mg | ORAL_TABLET | Freq: Four times a day (QID) | ORAL | 0 refills | Status: DC

## 2017-09-21 NOTE — Progress Notes (Signed)
ROB-Reports intermittent pelvic pressure and back pain. Requests SVE, unchanged since last visit. Education regarding post dates pregnancy care. Reviewed red flag symptoms and when to call. RTC x 1 week for ROB and NST with Deneise Lever or sooner if needed.

## 2017-09-21 NOTE — Anesthesia Postprocedure Evaluation (Signed)
Anesthesia Post Note  Patient: Melanie York  Procedure(s) Performed: AN AD Christine  Patient location during evaluation: Mother Baby Anesthesia Type: Epidural Level of consciousness: awake and alert and oriented Pain management: pain level controlled Vital Signs Assessment: post-procedure vital signs reviewed and stable Respiratory status: spontaneous breathing Cardiovascular status: stable Postop Assessment: no headache, patient able to bend at knees, no apparent nausea or vomiting and adequate PO intake Anesthetic complications: no     Last Vitals:  Vitals:   09/20/17 2340 09/21/17 0732  BP: 108/64 (!) 90/53  Pulse: 66 (!) 57  Resp: 18 20  Temp: 36.6 C 36.4 C  SpO2: 98% 99%    Last Pain:  Vitals:   09/21/17 0732  TempSrc: Oral  PainSc:                  [name]

## 2017-09-21 NOTE — Progress Notes (Signed)
Discharge inst reviewed with pt.  Depo given for home use.  Pt verb u/o of inst.

## 2017-09-21 NOTE — Discharge Instructions (Signed)
Vaginal Delivery, Care After °Refer to this sheet in the next few weeks. These instructions provide you with information about caring for yourself after vaginal delivery. Your health care provider may also give you more specific instructions. Your treatment has been planned according to current medical practices, but problems sometimes occur. Call your health care provider if you have any problems or questions. °What can I expect after the procedure? °After vaginal delivery, it is common to have: °· Some bleeding from your vagina. °· Soreness in your abdomen, your vagina, and the area of skin between your vaginal opening and your anus (perineum). °· Pelvic cramps. °· Fatigue. ° °Follow these instructions at home: °Medicines °· Take over-the-counter and prescription medicines only as told by your health care provider. °· If you were prescribed an antibiotic medicine, take it as told by your health care provider. Do not stop taking the antibiotic until it is finished. °Driving ° °· Do not drive or operate heavy machinery while taking prescription pain medicine. °· Do not drive for 24 hours if you received a sedative. °Lifestyle °· Do not drink alcohol. This is especially important if you are breastfeeding or taking medicine to relieve pain. °· Do not use tobacco products, including cigarettes, chewing tobacco, or e-cigarettes. If you need help quitting, ask your health care provider. °Eating and drinking °· Drink at least 8 eight-ounce glasses of water every day unless you are told not to by your health care provider. If you choose to breastfeed your baby, you may need to drink more water than this. °· Eat high-fiber foods every day. These foods may help prevent or relieve constipation. High-fiber foods include: °? Whole grain cereals and breads. °? Brown rice. °? Beans. °? Fresh fruits and vegetables. °Activity °· Return to your normal activities as told by your health care provider. Ask your health care provider  what activities are safe for you. °· Rest as much as possible. Try to rest or take a nap when your baby is sleeping. °· Do not lift anything that is heavier than your baby or 10 lb (4.5 kg) until your health care provider says that it is safe. °· Talk with your health care provider about when you can engage in sexual activity. This may depend on your: °? Risk of infection. °? Rate of healing. °? Comfort and desire to engage in sexual activity. °Vaginal Care °· If you have an episiotomy or a vaginal tear, check the area every day for signs of infection. Check for: °? More redness, swelling, or pain. °? More fluid or blood. °? Warmth. °? Pus or a bad smell. °· Do not use tampons or douches until your health care provider says this is safe. °· Watch for any blood clots that may pass from your vagina. These may look like clumps of dark red, brown, or black discharge. °General instructions °· Keep your perineum clean and dry as told by your health care provider. °· Wear loose, comfortable clothing. °· Wipe from front to back when you use the toilet. °· Ask your health care provider if you can shower or take a bath. If you had an episiotomy or a perineal tear during labor and delivery, your health care provider may tell you not to take baths for a certain length of time. °· Wear a bra that supports your breasts and fits you well. °· If possible, have someone help you with household activities and help care for your baby for at least a few days after   you leave the hospital. °· Keep all follow-up visits for you and your baby as told by your health care provider. This is important. °Contact a health care provider if: °· You have: °? Vaginal discharge that has a bad smell. °? Difficulty urinating. °? Pain when urinating. °? A sudden increase or decrease in the frequency of your bowel movements. °? More redness, swelling, or pain around your episiotomy or vaginal tear. °? More fluid or blood coming from your episiotomy or  vaginal tear. °? Pus or a bad smell coming from your episiotomy or vaginal tear. °? A fever. °? A rash. °? Little or no interest in activities you used to enjoy. °? Questions about caring for yourself or your baby. °· Your episiotomy or vaginal tear feels warm to the touch. °· Your episiotomy or vaginal tear is separating or does not appear to be healing. °· Your breasts are painful, hard, or turn red. °· You feel unusually sad or worried. °· You feel nauseous or you vomit. °· You pass large blood clots from your vagina. If you pass a blood clot from your vagina, save it to show to your health care provider. Do not flush blood clots down the toilet without having your health care provider look at them. °· You urinate more than usual. °· You are dizzy or light-headed. °· You have not breastfed at all and you have not had a menstrual period for 12 weeks after delivery. °· You have stopped breastfeeding and you have not had a menstrual period for 12 weeks after you stopped breastfeeding. °Get help right away if: °· You have: °? Pain that does not go away or does not get better with medicine. °? Chest pain. °? Difficulty breathing. °? Blurred vision or spots in your vision. °? Thoughts about hurting yourself or your baby. °· You develop pain in your abdomen or in one of your legs. °· You develop a severe headache. °· You faint. °· You bleed from your vagina so much that you fill two sanitary pads in one hour. °This information is not intended to replace advice given to you by your health care provider. Make sure you discuss any questions you have with your health care provider. °Document Released: 09/05/2000 Document Revised: 02/20/2016 Document Reviewed: 09/23/2015 °Elsevier Interactive Patient Education © 2018 [employer] ° °

## 2017-09-21 NOTE — Discharge Summary (Signed)
Obstetric Discharge Summary  Patient ID: Melanie York MRN: [id number] DOB/AGE: [birth year] 26 y.o.   Date of Admission: 09/19/2017 Dani Gobble, CNM Dellia Nims, MD)  Date of Discharge: Dani Gobble, CNM Dellia Nims, MD)  Admitting Diagnosis: Premature rupture of membrane at [redacted]w[redacted]d  Secondary Diagnosis: Anemia in pregnancy  Mode of Delivery: normal spontaneous vaginal delivery     Discharge Diagnosis: No other diagnosis   Intrapartum Procedures: pitocin augmentation   Post partum procedures: None  Complications: none   Brief Hospital Course   RANDA RISS is a [id number] who had a SVD on 09/20/2017;  for further details of this birtrh, please refer to the delivey note.  Patient had an uncomplicated postpartum course.  By time of discharge on PPD#1, her pain was controlled on oral pain medications; she had appropriate lochia and was ambulating, voiding without difficulty and tolerating regular diet.  She was deemed stable for discharge to home.    Labs:  CBC Latest Ref Rng & Units 09/21/2017 09/20/2017 09/19/2017  WBC 3.6 - 11.0 K/uL 8.9 15.9(H) 10.7  Hemoglobin 12.0 - 16.0 g/dL 10.6(L) 10.1(L) 10.6(L)  Hematocrit 35.0 - 47.0 % 31.5(L) 31.3(L) 31.9(L)  Platelets 150 - 440 K/uL 197 207 238   O POS  Physical exam:   Temp:  [97.6 F (36.4 C)-99.3 F (37.4 C)] 97.6 F (36.4 C) (12/31 0732) Pulse Rate:  [57-75] 57 (12/31 0732) Resp:  [18-20] 20 (12/31 0732) BP: (90-108)/(53-65) 90/53 (12/31 0732) SpO2:  [97 %-99 %] 99 % (12/31 0732)  General: alert and no distress  Lochia: appropriate  Abdomen: soft, NT  Uterine Fundus: firm  Perinuem: healing well, no significant drainage, no dehiscence, no significant erythema  Extremities: No evidence of DVT seen on physical exam. No lower extremity edema.  Discharge Instructions: Per After Visit Summary.  Activity: Advance as tolerated. Pelvic rest for 6 weeks.  Also refer to After Visit Summary  Diet:  Regular  Medications:  Allergies as of 09/21/2017      Reactions   Naproxen Rash      Medication List    TAKE these medications   CONCEPT DHA 53.5-38-1 MG Caps Take 1 capsule by mouth daily.   ferrous sulfate 325 (65 FE) MG tablet Take 1 tablet (325 mg total) by mouth 3 (three) times daily with meals.   ibuprofen 600 MG tablet Commonly known as:  ADVIL,MOTRIN Take 1 tablet (600 mg total) by mouth every 6 (six) hours.      Outpatient follow up:  Follow-up Information    Diona Fanti, CNM. Schedule an appointment as soon as possible for a visit in 6 week(s).   Specialties:  Certified Nurse Midwife, Obstetrics and Gynecology, Radiology Why:  please call [facility] and make your own 6 week follow up appointment Contact information: [address] [place]  [phone]          Postpartum contraception: Depo-Provera  Discharged Condition: stable  Discharged to: home   Newborn Data:  Disposition:home with mother  Apgars: APGAR (1 MIN): 8   APGAR (5 MINS): 9    Baby Feeding: Bottle   Diona Fanti, CNM [facility]

## 2017-09-21 NOTE — Progress Notes (Signed)
Discharged to home to car via auxillary 

## 2017-09-23 ENCOUNTER — Other Ambulatory Visit: Payer: Medicaid Other

## 2017-09-23 ENCOUNTER — Encounter: Payer: Medicaid Other | Admitting: Certified Nurse Midwife

## 2017-12-13 ENCOUNTER — Encounter (HOSPITAL_COMMUNITY): Payer: Self-pay

## 2018-01-29 ENCOUNTER — Encounter: Payer: Self-pay | Admitting: Emergency Medicine

## 2018-01-29 ENCOUNTER — Emergency Department: Payer: Self-pay

## 2018-01-29 ENCOUNTER — Emergency Department
Admission: EM | Admit: 2018-01-29 | Discharge: 2018-01-29 | Disposition: A | Discharge status: Home / Self Care | Payer: Self-pay | Attending: Emergency Medicine | Admitting: Emergency Medicine

## 2018-01-29 DIAGNOSIS — F17200 Nicotine dependence, unspecified, uncomplicated: Secondary | ICD-10-CM | POA: Insufficient documentation

## 2018-01-29 DIAGNOSIS — R109 Unspecified abdominal pain: Secondary | ICD-10-CM

## 2018-01-29 DIAGNOSIS — Z79899 Other long term (current) drug therapy: Secondary | ICD-10-CM | POA: Insufficient documentation

## 2018-01-29 DIAGNOSIS — R102 Pelvic and perineal pain: Secondary | ICD-10-CM

## 2018-01-29 DIAGNOSIS — K5900 Constipation, unspecified: Secondary | ICD-10-CM

## 2018-01-29 LAB — URINALYSIS, COMPLETE (UACMP) WITH MICROSCOPIC
Bilirubin Urine: NEGATIVE
Glucose, UA: NEGATIVE mg/dL
Hgb urine dipstick: NEGATIVE
KETONES UR: NEGATIVE mg/dL
Nitrite: NEGATIVE
PROTEIN: NEGATIVE mg/dL
Specific Gravity, Urine: 1.019 (ref 1.005–1.030)
pH: 6 (ref 5.0–8.0)

## 2018-01-29 LAB — COMPREHENSIVE METABOLIC PANEL
ALBUMIN: 4.2 g/dL (ref 3.5–5.0)
ALK PHOS: 84 U/L (ref 38–126)
ALT: 19 U/L (ref 14–54)
ANION GAP: 6 (ref 5–15)
AST: 20 U/L (ref 15–41)
BUN: 16 mg/dL (ref 6–20)
CHLORIDE: 105 mmol/L (ref 101–111)
CO2: 25 mmol/L (ref 22–32)
Calcium: 9.2 mg/dL (ref 8.9–10.3)
Creatinine, Ser: 0.7 mg/dL (ref 0.44–1.00)
GFR calc non Af Amer: >60 mL/min (ref >60)
GLUCOSE: 88 mg/dL (ref 65–99)
Potassium: 4.1 mmol/L (ref 3.5–5.1)
SODIUM: 136 mmol/L (ref 135–145)
Total Bilirubin: 0.5 mg/dL (ref 0.3–1.2)
Total Protein: 7.3 g/dL (ref 6.5–8.1)

## 2018-01-29 LAB — CBC
HEMATOCRIT: 40 % (ref 35.0–47.0)
HEMOGLOBIN: 13.4 g/dL (ref 12.0–16.0)
MCH: 30.9 pg (ref 26.0–34.0)
MCHC: 33.5 g/dL (ref 32.0–36.0)
MCV: 92.2 fL (ref 80.0–100.0)
Platelets: 245 10*3/uL (ref 150–440)
RBC: 4.34 MIL/uL (ref 3.80–5.20)
RDW: 12.9 % (ref 11.5–14.5)
WBC: 5.5 10*3/uL (ref 3.6–11.0)

## 2018-01-29 LAB — LIPASE, BLOOD: LIPASE: 36 U/L (ref 11–51)

## 2018-01-29 LAB — PREGNANCY, URINE: Preg Test, Ur: NEGATIVE

## 2018-01-29 LAB — POCT PREGNANCY, URINE: Preg Test, Ur: NEGATIVE

## 2018-01-29 MED ORDER — IOPAMIDOL (ISOVUE-370) INJECTION 76%
75.0000 mL | Freq: Once | INTRAVENOUS | Status: AC | PRN
  Administered 2018-01-29: 75 mL via INTRAVENOUS
  Filled 2018-01-29: qty 75

## 2018-01-29 MED ORDER — POLYETHYLENE GLYCOL 3350 17 G PO PACK
17.0000 g | PACK | Freq: Every day | ORAL | 0 refills | Status: DC
Start: 2018-01-29 — End: 2020-05-07

## 2018-01-29 MED ORDER — ONDANSETRON HCL 4 MG/2ML IJ SOLN
4.0000 mg | Freq: Once | INTRAMUSCULAR | Status: AC | PRN
  Administered 2018-01-29: 4 mg via INTRAVENOUS
  Filled 2018-01-29: qty 2

## 2018-01-29 MED ORDER — MAGNESIUM CITRATE PO SOLN: 1.0000 | Freq: Once | ORAL | 0 refills | Status: AC

## 2018-01-29 MED ORDER — DICYCLOMINE HCL 20 MG PO TABS: 20.0000 mg | ORAL_TABLET | Freq: Three times a day (TID) | ORAL | 0 refills | Status: DC | PRN

## 2018-01-29 MED ORDER — FENTANYL CITRATE (PF) 100 MCG/2ML IJ SOLN
50.0000 ug | INTRAMUSCULAR | Status: DC | PRN
  Administered 2018-01-29: 50 ug via INTRAVENOUS
  Filled 2018-01-29 (×2): qty 2

## 2018-01-29 MED ORDER — FENTANYL CITRATE (PF) 100 MCG/2ML IJ SOLN
50.0000 ug | Freq: Once | INTRAMUSCULAR | Status: AC
  Administered 2018-01-29: 50 ug via INTRAVENOUS

## 2018-01-29 NOTE — Consult Note (Signed)
SURGICAL CONSULTATION NOTE (initial) - cpt: 78588)  HISTORY OF PRESENT ILLNESS (HPI):  27 y.o. female presented to [facility] ED today for evaluation of abdominal pain. Patient reports she felt fine yesterday, but this morning ~10 - 11 am developed abdominal pain that began as crampy in her RLQ and then became "stabbing" "in waves" suprapubic and Right-sided abdominal pain. Patient says she's been constipated "all my life", typically has BM's only 3x/week, passed a small hard BM this morning with straining, after which her cramping abdominal pain began. She was previously prescribed Colace stool softener after the birth of one or more of her 4 children, but she has not taken any such medication(s) recently and does not recall having ever taken Miralax or magnesium citrate. She otherwise denies fever/chills, diarrhea, N/V, CP, or SOB.  Surgery is consulted by ED physician Dr. Archie Balboa in this context for evaluation and management of RLQ abdominal pain.  PAST MEDICAL HISTORY (PMH):  History reviewed. No pertinent past medical history.   PAST SURGICAL HISTORY ([place]):  Past Surgical History:  Procedure Laterality Date  . TONSILLECTOMY    . TONSILLECTOMY       MEDICATIONS:  Prior to Admission medications   Medication Sig Start Date End Date Taking? Authorizing Provider  ferrous sulfate 325 (65 FE) MG tablet Take 1 tablet (325 mg total) by mouth 3 (three) times daily with meals. 09/21/17   Diona Fanti, CNM  ibuprofen (ADVIL,MOTRIN) 600 MG tablet Take 1 tablet (600 mg total) by mouth every 6 (six) hours. 09/21/17   Diona Fanti, CNM  Prenat-FeFum-FePo-FA-Omega 3 (CONCEPT DHA) 53.5-38-1 MG CAPS Take 1 capsule by mouth daily. 02/04/17   Philip Aspen, CNM     ALLERGIES:  Allergies  Allergen Reactions  . Naproxen Rash     SOCIAL HISTORY:  Social History   Socioeconomic History  . Marital status: Single    Spouse name: Not on file  . Number of children: Not on file  .  Years of education: Not on file  . Highest education level: Not on file  Occupational History  . Not on file  Social Needs  . Financial resource strain: Not on file  . Food insecurity:    Worry: Not on file    Inability: Not on file  . Transportation needs:    Medical: Not on file    Non-medical: Not on file  Tobacco Use  . Smoking status: Current Every Day Smoker    Packs/day: 0.50  . Smokeless tobacco: Never Used  Substance and Sexual Activity  . Alcohol use: Yes    Comment: occ. before pregnancy  . Drug use: No  . Sexual activity: Yes    Partners: Male    Birth control/protection: None  Lifestyle  . Physical activity:    Days per week: Not on file    Minutes per session: Not on file  . Stress: Not on file  Relationships  . Social connections:    Talks on phone: Not on file    Gets together: Not on file    Attends religious service: Not on file    Active member of club or organization: Not on file    Attends meetings of clubs or organizations: Not on file    Relationship status: Not on file  . Intimate partner violence:    Fear of current or ex partner: Not on file    Emotionally abused: Not on file    Physically abused: Not on file    Forced  sexual activity: Not on file  Other Topics Concern  . Not on file  Social History Narrative  . Not on file    The patient currently resides (home / rehab facility / nursing home): Home The patient normally is (ambulatory / bedbound): Ambulatory   FAMILY HISTORY:  Family History  Problem Relation Age of Onset  . Diabetes Mother   . Cancer Mother        cervical     REVIEW OF SYSTEMS:  Constitutional: denies weight loss, fever, chills, or sweats  Eyes: denies any other vision changes, history of eye injury  ENT: denies sore throat, hearing problems  Respiratory: denies shortness of breath, wheezing  Cardiovascular: denies chest pain, palpitations  Gastrointestinal: abdominal pain, N/V, and bowel function as per  HPI Genitourinary: denies burning with urination or urinary frequency Musculoskeletal: denies any other joint pains or cramps  Skin: denies any other rashes or skin discolorations  Neurological: denies any other headache, dizziness, weakness  Psychiatric: denies any other depression, anxiety   All other review of systems were negative   VITAL SIGNS:  Temp:  [98.5 F (36.9 C)] 98.5 F (36.9 C) (05/10 1731) Pulse Rate:  [76] 76 (05/10 1731) Resp:  [16] 16 (05/10 1731) BP: (123)/(75) 123/75 (05/10 1731) SpO2:  [99 %] 99 % (05/10 1731) Weight:  [147 lb (66.7 kg)] 147 lb (66.7 kg) (05/10 1732)     Height: 5\' 4"  (162.6 cm) Weight: 147 lb (66.7 kg) BMI (Calculated): 25.22   INTAKE/OUTPUT:  This shift: No intake/output data recorded.  Last 2 shifts: @IOLAST2SHIFTS @   PHYSICAL EXAM:  Constitutional:  -- Normal body habitus  -- Awake, alert, and oriented x3, no apparent distress Eyes:  -- Pupils equally round and reactive to light  -- No scleral icterus, B/L no occular discharge Ear, nose, throat: -- Neck is FROM WNL -- No jugular venous distension  Pulmonary:  -- No wheezes or rhales -- Equal breath sounds bilaterally -- Breathing non-labored at rest Cardiovascular:  -- S1, S2 present  -- No pericardial rubs  Gastrointestinal:  -- Abdomen soft and non-distended with focal suprapubic > RLQ > right-sided abdominal tenderness to deep palpation, no guarding or rebound tenderness -- No abdominal masses appreciated, pulsatile or otherwise  Musculoskeletal and Integumentary:  -- Wounds or skin discoloration: None appreciated -- Extremities: B/L UE and LE FROM, hands and feet warm, no edema  Neurologic:  -- Motor function: Intact and symmetric -- Sensation: Intact and symmetric Psychiatric:  -- Mood and affect WNL  Labs:  CBC Latest Ref Rng & Units 01/29/2018 09/21/2017 09/20/2017  WBC 3.6 - 11.0 K/uL 5.5 8.9 15.9(H)  Hemoglobin 12.0 - 16.0 g/dL 13.4 10.6(L) 10.1(L)  Hematocrit  35.0 - 47.0 % 40.0 31.5(L) 31.3(L)  Platelets 150 - 440 K/uL 245 197 207   CMP Latest Ref Rng & Units 01/29/2018 02/19/2017  Glucose 65 - 99 mg/dL 88 94  BUN 6 - 20 mg/dL 16 9  Creatinine 0.44 - 1.00 mg/dL 0.70 0.59  Sodium 135 - 145 mmol/L 136 133(L)  Potassium 3.5 - 5.1 mmol/L 4.1 3.9  Chloride 101 - 111 mmol/L 105 103  CO2 22 - 32 mmol/L 25 24  Calcium 8.9 - 10.3 mg/dL 9.2 9.0  Total Protein 6.5 - 8.1 g/dL 7.3 -  Total Bilirubin 0.3 - 1.2 mg/dL 0.5 -  Alkaline Phos 38 - 126 U/L 84 -  AST 15 - 41 U/L 20 -  ALT 14 - 54 U/L 19 -  Imaging studies:  CT Abdomen and Pelvis with IV Contrast only (01/29/2018) - personally reviewed with patient and her mom bedside, discussed with ED physician Due to lack of bowel opacification, it is difficult to separate the appendix from unopacified small bowel loops in the inferior pelvis to the left of midline. Therefore, early changes of appendicitis cannot be excluded. There are no gross changes of acute appendicitis at this time.  Assessment/Plan: (ICD-10's: K59.00) 27 y.o. female with suprapubic > RLQ > right-sided cramping and then stabbing abdominal pain without fever/chills or leukocytosis in the context of chronic constipation with a hard small BM this morning, BM x only 3 per week and poor visualization on CT of patient's appendix, though without surrounding stranding/inflammation to suggest acute appendicitis, but Right-sided constipation exactly where patient complains of pain, constipation seems more likely than appendicitis, which though less likely cannot be completely excluded.   - maintain hydration and high-fiber diet   - Colace stool softener once daily to reduce risk of constipation (stop if BM's are loose)   - Miralax or magnesium citrate once daily until symptoms resolve bowel function normalizes  - patient instructed to return to ED if pain worsens or she develops N/V or fever  All of the above findings and recommendations were  discussed with the patient, her mom, and ED physician, and all of patient's and her family's questions were answered to their expressed satisfaction.  Thank you for the opportunity to participate in this patient's care.   -- Marilynne Drivers Rosana Hoes, MD, [place]: [facility] - Partnering for exceptional care. Office: [phone]

## 2018-01-29 NOTE — Discharge Instructions (Addendum)
Please seek medical attention for any high fevers, chest pain, shortness of breath, change in behavior, persistent vomiting, bloody stool or any other new or concerning symptoms.  

## 2018-01-29 NOTE — ED Provider Notes (Signed)
[facility] Emergency Department Provider Note  ____________________________________________   I have reviewed the triage vital signs and the nursing notes.   HISTORY  Chief Complaint Pelvic Pain   History limited by: Not Limited   HPI Melanie York is a 27 y.o. female who presents to the emergency department today because of concern for right lower quadrant abdominal pain. It started at 10 am this morning. Started suddenly. It has been fairly constant since it started. Will be slightly relieved with decreased movement. The patient has never had similar pain in the past. She denies any change to urination or defecation. Denies any fevers.    Per medical record review patient has a history of recent pregnancy  History reviewed. No pertinent past medical history.  Patient Active Problem List   Diagnosis Date Noted  . Full-term premature rupture of membranes 09/19/2017  . History of postpartum hemorrhage 09/19/2017  . Pregnancy 08/06/2017  . Anemia in pregnancy 07/09/2017  . History of preterm labor 03/09/2017  . History of marijuana use 03/09/2017    Past Surgical History:  Procedure Laterality Date  . TONSILLECTOMY    . TONSILLECTOMY      Prior to Admission medications   Medication Sig Start Date End Date Taking? Authorizing Provider  ferrous sulfate 325 (65 FE) MG tablet Take 1 tablet (325 mg total) by mouth 3 (three) times daily with meals. 09/21/17   Diona Fanti, CNM  ibuprofen (ADVIL,MOTRIN) 600 MG tablet Take 1 tablet (600 mg total) by mouth every 6 (six) hours. 09/21/17   Diona Fanti, CNM  Prenat-FeFum-FePo-FA-Omega 3 (CONCEPT DHA) 53.5-38-1 MG CAPS Take 1 capsule by mouth daily. 02/04/17   Philip Aspen, CNM    Allergies Naproxen  Family History  Problem Relation Age of Onset  . Diabetes Mother   . Cancer Mother        cervical    Social History Social History   Tobacco Use  . Smoking status:  Current Every Day Smoker    Packs/day: 0.50  . Smokeless tobacco: Never Used  Substance Use Topics  . Alcohol use: Yes    Comment: occ. before pregnancy  . Drug use: No    Review of Systems Constitutional: No fever/chills Eyes: No visual changes. ENT: No sore throat. Cardiovascular: Denies chest pain. Respiratory: Denies shortness of breath. Gastrointestinal: Positive for right lower quadrant pain. Genitourinary: Negative for dysuria. Musculoskeletal: Negative for back pain. Skin: Negative for rash. Neurological: Negative for headaches, focal weakness or numbness.  ____________________________________________   PHYSICAL EXAM:  VITAL SIGNS: ED Triage Vitals  Enc Vitals Group     BP 01/29/18 1731 123/75     Pulse Rate 01/29/18 1731 76     Resp 01/29/18 1731 16     Temp 01/29/18 1731 98.5 F (36.9 C)     Temp Source 01/29/18 1731 Oral     SpO2 01/29/18 1731 99 %     Weight 01/29/18 1732 147 lb (66.7 kg)     Height 01/29/18 1732 5\' 4"  (1.626 m)     Head Circumference --      Peak Flow --      Pain Score 01/29/18 1732 10   Constitutional: Alert and oriented. Well appearing and in no distress. Eyes: Conjunctivae are normal.  ENT   Head: Normocephalic and atraumatic.   Nose: No congestion/rhinnorhea.   Mouth/Throat: Mucous membranes are moist.   Neck: No stridor. Hematological/Lymphatic/Immunilogical: No cervical lymphadenopathy. Cardiovascular: Normal rate, regular rhythm.  No murmurs,  rubs, or gallops.  Respiratory: Normal respiratory effort without tachypnea nor retractions. Breath sounds are clear and equal bilaterally. No wheezes/rales/rhonchi. Gastrointestinal: Soft and tender to palpation in the right lower quadrant. No rebound. No guarding.  Genitourinary: Deferred Musculoskeletal: Normal range of motion in all extremities. No lower extremity edema. Neurologic:  Normal speech and language. No gross focal neurologic deficits are appreciated.  Skin:   Skin is warm, dry and intact. No rash noted. Psychiatric: Mood and affect are normal. Speech and behavior are normal. Patient exhibits appropriate insight and judgment.  ____________________________________________    LABS (pertinent positives/negatives)  Upreg negative UA hazy, negative nitrite, small leukocytes, 6-10 wbc, 11-20 squamous epithelial  Lipase 36 CMP wnl CBC wnl ____________________________________________   EKG  None  ____________________________________________    RADIOLOGY  US pelvis Left ovarian cyst  CT abd/pel Possible early appendicitis  ____________________________________________   PROCEDURES  Procedures  ____________________________________________   INITIAL IMPRESSION / ASSESSMENT AND PLAN / ED COURSE  Pertinent labs & imaging results that were available during my care of the patient were reviewed by me and considered in my medical decision making (see chart for details).  Patient presents to the emergency department today because of concerns for right lower abdominal pain.  Differential would be broad including appendicitis, ovarian torsion, ovarian cyst, gastroenteritis amongst other etiologies.  Given lack of leukocytosis and fever ultrasound was performed initially to evaluate for ovarian pathology.  This showed ovarian cyst however it was on the left side and I do not think this is explain the pain.  CT scan was then obtained which was concerning for possible early appendicitis.  Dr. Rosana Hoes with surgery did come down and evaluate the patient.  This point he does not think it is consistent with appendicitis.  Thinks more related to constipation.  I did discuss this with the patient.  Will give patient MiraLAX and magnesium citrate prescription.  Discussed appendicitis return precautions.  ____________________________________________   FINAL CLINICAL IMPRESSION(S) / ED DIAGNOSES  Final diagnoses:  Adnexal pain  Abdominal pain,  unspecified abdominal location  Constipation, unspecified constipation type     Note: This dictation was prepared with Dragon dictation. Any transcriptional errors that result from this process are unintentional     Nance Pear, MD 01/29/18 2257

## 2018-01-29 NOTE — ED Triage Notes (Signed)
Patient presents to the ED with right lower quadrant abdominal/pelvic pain that began around 11am today.  Patient reports pain is very sharp and stabbing.  Patient states she last had her period 1 week ago.  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  Patient is holding and guarding abdomen.  Patient is tearful.

## 2018-01-31 LAB — URINE CULTURE

## 2018-02-09 DIAGNOSIS — K5900 Constipation, unspecified: Secondary | ICD-10-CM | POA: Insufficient documentation

## 2019-10-04 ENCOUNTER — Ambulatory Visit: Payer: Medicaid Other | Attending: Internal Medicine

## 2019-10-04 DIAGNOSIS — Z20822 Contact with and (suspected) exposure to covid-19: Secondary | ICD-10-CM

## 2019-10-06 ENCOUNTER — Telehealth: Payer: Self-pay | Admitting: *Deleted

## 2019-10-06 LAB — NOVEL CORONAVIRUS, NAA: SARS-CoV-2, NAA: NOT DETECTED

## 2019-10-06 NOTE — Telephone Encounter (Signed)
Patient.s dad called given negative covid results .

## 2020-05-07 ENCOUNTER — Other Ambulatory Visit (HOSPITAL_COMMUNITY)
Admission: RE | Admit: 2020-05-07 | Discharge: 2020-05-07 | Disposition: A | Discharge status: Home / Self Care | Payer: Medicaid Other | Source: Ambulatory Visit | Attending: Certified Nurse Midwife | Admitting: Certified Nurse Midwife

## 2020-05-07 ENCOUNTER — Other Ambulatory Visit: Payer: Self-pay

## 2020-05-07 ENCOUNTER — Ambulatory Visit (INDEPENDENT_AMBULATORY_CARE_PROVIDER_SITE_OTHER): Payer: Self-pay | Admitting: Certified Nurse Midwife

## 2020-05-07 ENCOUNTER — Encounter: Payer: Self-pay | Admitting: Certified Nurse Midwife

## 2020-05-07 VITALS — BP 105/73 | HR 82 | Ht 64.0 in | Wt 127.0 lb

## 2020-05-07 DIAGNOSIS — N941 Unspecified dyspareunia: Secondary | ICD-10-CM | POA: Insufficient documentation

## 2020-05-07 DIAGNOSIS — N898 Other specified noninflammatory disorders of vagina: Secondary | ICD-10-CM

## 2020-05-07 NOTE — Progress Notes (Signed)
GYN ENCOUNTER NOTE  Subjective:       Melanie York is a 29 y.o. [phone] female is here for gynecologic evaluation of the following issues:  1. dyspareunia x 3 months and increased vaginal discharge x 1 month with odor. She denies any new partners. Denies fever, itching, burning.   Gynecologic History Patient's last menstrual period was 04/25/2020 (exact date). Contraception: none, declines BC  Upstream - 05/07/20 1348      Pregnancy Intention Screening   Does the patient want to become pregnant in the next year? Yes    Does the patient's partner want to become pregnant in the next year? Yes    Would the patient like to discuss contraceptive options today? No      Contraception Wrap Up   Contraception Counseling Provided No           Last Pap: 03/09/17. Results were: normal Last mammogram: n/a   Obstetric History OB History  Gravida Para Term Preterm AB Living  4 3 3     3   SAB TAB Ectopic Multiple Live Births          3    # Outcome Date GA Lbr Len/2nd Weight Sex Delivery Anes PTL Lv  4 Gravida           3 Term 05/20/13 [redacted]w[redacted]d  6 lb 1.6 oz (2.767 kg) F Vag-Spont   LIV  2 Term 09/21/09 [redacted]w[redacted]d  6 lb 2.2 oz (2.785 kg) M Vag-Spont   LIV  1 Term 11/24/07 [redacted]w[redacted]d  7 lb 1.6 oz (3.221 kg) F Vag-Spont   LIV    No past medical history on file.  Past Surgical History:  Procedure Laterality Date  . TONSILLECTOMY    . TONSILLECTOMY      No current outpatient medications on file prior to visit.   No current facility-administered medications on file prior to visit.    Allergies  Allergen Reactions  . Naproxen Rash    Social History   Socioeconomic History  . Marital status: Single    Spouse name: Not on file  . Number of children: 4  . Years of education: Not on file  . Highest education level: Not on file  Occupational History  . Not on file  Tobacco Use  . Smoking status: Current Every Day Smoker    Packs/day: 0.50  . Smokeless tobacco: Never Used  Vaping Use   . Vaping Use: Never used  Substance and Sexual Activity  . Alcohol use: Yes    Comment: occ. before pregnancy  . Drug use: No  . Sexual activity: Yes    Partners: Male    Birth control/protection: None  Other Topics Concern  . Not on file  Social History Narrative  . Not on file   Social Determinants of Health   Financial Resource Strain:   . Difficulty of Paying Living Expenses:   Food Insecurity:   . Worried About Charity fundraiser in the Last Year:   . Arboriculturist in the Last Year:   Transportation Needs:   . Film/video editor (Medical):   Marland Kitchen Lack of Transportation (Non-Medical):   Physical Activity:   . Days of Exercise per Week:   . Minutes of Exercise per Session:   Stress:   . Feeling of Stress :   Social Connections:   . Frequency of Communication with Friends and Family:   . Frequency of Social Gatherings with Friends and Family:   . Attends  Religious Services:   . Active Member of Clubs or Organizations:   . Attends Archivist Meetings:   Marland Kitchen Marital Status:   Intimate Partner Violence:   . Fear of Current or Ex-Partner:   . Emotionally Abused:   Marland Kitchen Physically Abused:   . Sexually Abused:     Family History  Problem Relation Age of Onset  . Diabetes Mother   . Cancer Mother        cervical    The following portions of the patient's history were reviewed and updated as appropriate: allergies, current medications, past family history, past medical history, past social history, past surgical history and problem list.  Review of Systems Review of Systems - Negative except as mentioned in HPI Review of Systems - General ROS: negative for - chills, fatigue, fever, hot flashes, malaise or night sweats Hematological and Lymphatic ROS: negative for - bleeding problems or swollen lymph nodes Gastrointestinal ROS: negative for - abdominal pain, blood in stools, change in bowel habits and nausea/vomiting Musculoskeletal ROS: negative for -  joint pain, muscle pain or muscular weakness Genito-Urinary ROS: negative for - change in menstrual cycle, dysmenorrhea, dyspareunia, dysuria, genital discharge, genital ulcers, hematuria, incontinence, irregular/heavy menses, nocturia or pelvic painjj  Objective:   BP 105/73   Pulse 82   Ht 5\' 4"  (1.626 m)   Wt 127 lb (57.6 kg)   LMP 04/25/2020 (Exact Date)   BMI 21.80 kg/m  CONSTITUTIONAL: Well-developed, well-nourished female in no acute distress.  HENT:  Normocephalic, atraumatic.  NECK: Normal range of motion, supple, no masses.  Normal thyroid.  SKIN: Skin is warm and dry. No rash noted. Not diaphoretic. No erythema. No pallor. [place]: Alert and oriented to person, place, and time. PSYCHIATRIC: Normal mood and affect. Normal behavior. Normal judgment and thought content. CARDIOVASCULAR:Not Examined RESPIRATORY: Not Examined BREASTS: Not Examined ABDOMEN: Soft, non distended; Non tender.  No Organomegaly. PELVIC:  External Genitalia: Normal  BUS: Normal  Vagina: Normal  Cervix: Normal  Uterus: Normal size, shape,consistency, mobile, mild discomfort with cervical exam. White yellow discharge.   Adnexa: Normal  RV: Normal   Bladder: Nontender MUSCULOSKELETAL: Normal range of motion. No tenderness.  No cyanosis, clubbing, or edema.     Assessment:   dyspareunia  vaginal discharge with odor   Plan:   Discussed normal discomfort with deep penitration due to stimulation of the cervix. Felt similar sensation with bimanual exam. Swab collect. Will follow up with results. Follow up for u/s to r/oother causes of dyspareunias. Follow up for annual exam 1-2 wks.   Philip Aspen, CNM

## 2020-05-09 ENCOUNTER — Telehealth: Payer: Self-pay

## 2020-05-09 LAB — CERVICOVAGINAL ANCILLARY ONLY
Bacterial Vaginitis (gardnerella): POSITIVE — AB
Candida Glabrata: NEGATIVE
Candida Vaginitis: NEGATIVE
Chlamydia: NEGATIVE
Comment: NEGATIVE
Comment: NEGATIVE
Comment: NEGATIVE
Comment: NEGATIVE
Comment: NEGATIVE
Comment: NORMAL
Neisseria Gonorrhea: NEGATIVE
Trichomonas: NEGATIVE

## 2020-05-09 MED ORDER — METRONIDAZOLE 500 MG PO TABS: 500.0000 mg | ORAL_TABLET | Freq: Two times a day (BID) | ORAL | 0 refills | Status: DC

## 2020-05-09 NOTE — Telephone Encounter (Signed)
Spoke with patient- script for flagyl sent to preferred pharmacy- culture positive for BV.

## 2020-05-15 ENCOUNTER — Other Ambulatory Visit: Payer: Self-pay | Admitting: Certified Nurse Midwife

## 2020-05-22 ENCOUNTER — Other Ambulatory Visit (HOSPITAL_COMMUNITY)
Admission: RE | Admit: 2020-05-22 | Discharge: 2020-05-22 | Disposition: A | Discharge status: Home / Self Care | Payer: Medicaid Other | Source: Ambulatory Visit | Attending: Certified Nurse Midwife | Admitting: Certified Nurse Midwife

## 2020-05-22 ENCOUNTER — Other Ambulatory Visit: Payer: Self-pay

## 2020-05-22 ENCOUNTER — Ambulatory Visit (INDEPENDENT_AMBULATORY_CARE_PROVIDER_SITE_OTHER): Payer: Self-pay

## 2020-05-22 ENCOUNTER — Other Ambulatory Visit: Payer: Self-pay | Admitting: Certified Nurse Midwife

## 2020-05-22 ENCOUNTER — Ambulatory Visit (INDEPENDENT_AMBULATORY_CARE_PROVIDER_SITE_OTHER): Payer: Medicaid Other | Admitting: Certified Nurse Midwife

## 2020-05-22 ENCOUNTER — Encounter: Payer: Self-pay | Admitting: Certified Nurse Midwife

## 2020-05-22 VITALS — BP 98/59 | HR 54 | Ht 64.0 in | Wt 128.5 lb

## 2020-05-22 DIAGNOSIS — Z01419 Encounter for gynecological examination (general) (routine) without abnormal findings: Secondary | ICD-10-CM

## 2020-05-22 DIAGNOSIS — Z124 Encounter for screening for malignant neoplasm of cervix: Secondary | ICD-10-CM

## 2020-05-22 DIAGNOSIS — Z1159 Encounter for screening for other viral diseases: Secondary | ICD-10-CM

## 2020-05-22 DIAGNOSIS — Z1151 Encounter for screening for human papillomavirus (HPV): Secondary | ICD-10-CM | POA: Insufficient documentation

## 2020-05-22 DIAGNOSIS — Z136 Encounter for screening for cardiovascular disorders: Secondary | ICD-10-CM

## 2020-05-22 DIAGNOSIS — N941 Unspecified dyspareunia: Secondary | ICD-10-CM

## 2020-05-22 NOTE — Patient Instructions (Signed)
Preventive Care 21-30 Years Old, Female Preventive care refers to visits with your health care provider and lifestyle choices that can promote health and wellness. This includes:  A yearly physical exam. This may also be called an annual well check.  Regular dental visits and eye exams.  Immunizations.  Screening for certain conditions.  Healthy lifestyle choices, such as eating a healthy diet, getting regular exercise, not using drugs or products that contain nicotine and tobacco, and limiting alcohol use. What can I expect for my preventive care visit? Physical exam Your health care provider will check your:  Height and weight. This may be used to calculate body mass index (BMI), which tells if you are at a healthy weight.  Heart rate and blood pressure.  Skin for abnormal spots. Counseling Your health care provider may ask you questions about your:  Alcohol, tobacco, and drug use.  Emotional well-being.  Home and relationship well-being.  Sexual activity.  Eating habits.  Work and work environment.  Method of birth control.  Menstrual cycle.  Pregnancy history. What immunizations do I need?  Influenza (flu) vaccine  This is recommended every year. Tetanus, diphtheria, and pertussis (Tdap) vaccine  You may need a Td booster every 10 years. Varicella (chickenpox) vaccine  You may need this if you have not been vaccinated. Human papillomavirus (HPV) vaccine  If recommended by your health care provider, you may need three doses over 6 months. Measles, mumps, and rubella (MMR) vaccine  You may need at least one dose of MMR. You may also need a second dose. Meningococcal conjugate (MenACWY) vaccine  One dose is recommended if you are age 19-21 years and a first-year college student living in a residence hall, or if you have one of several medical conditions. You may also need additional booster doses. Pneumococcal conjugate (PCV13) vaccine  You may need  this if you have certain conditions and were not previously vaccinated. Pneumococcal polysaccharide (PPSV23) vaccine  You may need one or two doses if you smoke cigarettes or if you have certain conditions. Hepatitis A vaccine  You may need this if you have certain conditions or if you travel or work in places where you may be exposed to hepatitis A. Hepatitis B vaccine  You may need this if you have certain conditions or if you travel or work in places where you may be exposed to hepatitis B. Haemophilus influenzae type b (Hib) vaccine  You may need this if you have certain conditions. You may receive vaccines as individual doses or as more than one vaccine together in one shot (combination vaccines). Talk with your health care provider about the risks and benefits of combination vaccines. What tests do I need?  Blood tests  Lipid and cholesterol levels. These may be checked every 5 years starting at age 20.  Hepatitis C test.  Hepatitis B test. Screening  Diabetes screening. This is done by checking your blood sugar (glucose) after you have not eaten for a while (fasting).  Sexually transmitted disease (STD) testing.  BRCA-related cancer screening. This may be done if you have a family history of breast, ovarian, tubal, or peritoneal cancers.  Pelvic exam and Pap test. This may be done every 3 years starting at age 21. Starting at age 30, this may be done every 5 years if you have a Pap test in combination with an HPV test. Talk with your health care provider about your test results, treatment options, and if necessary, the need for more tests.   Follow these instructions at home: Eating and drinking   Eat a diet that includes fresh fruits and vegetables, whole grains, lean protein, and low-fat dairy.  Take vitamin and mineral supplements as recommended by your health care provider.  Do not drink alcohol if: ? Your health care provider tells you not to drink. ? You are  pregnant, may be pregnant, or are planning to become pregnant.  If you drink alcohol: ? Limit how much you have to 0-1 drink a day. ? Be aware of how much alcohol is in your drink. In the [place] one drink equals one 12 oz bottle of beer (355 mL), one 5 oz glass of wine (148 mL), or one 1 oz glass of hard liquor (44 mL). Lifestyle  Take daily care of your teeth and gums.  Stay active. Exercise for at least 30 minutes on 5 or more days each week.  Do not use any products that contain nicotine or tobacco, such as cigarettes, e-cigarettes, and chewing tobacco. If you need help quitting, ask your health care provider.  If you are sexually active, practice safe sex. Use a condom or other form of birth control (contraception) in order to prevent pregnancy and STIs (sexually transmitted infections). If you plan to become pregnant, see your health care provider for a preconception visit. What's next?  Visit your health care provider once a year for a well check visit.  Ask your health care provider how often you should have your eyes and teeth checked.  Stay up to date on all vaccines. This information is not intended to replace advice given to you by your health care provider. Make sure you discuss any questions you have with your health care provider. Document Revised: 05/20/2018 Document Reviewed: 05/20/2018 Elsevier Patient Education  2020 [employer].

## 2020-05-22 NOTE — Progress Notes (Signed)
GYNECOLOGY ANNUAL PREVENTATIVE CARE ENCOUNTER NOTE  History:     Melanie York is a 29 y.o. [phone] female here for a routine annual gynecologic exam.  Current complaints: none.   Denies abnormal vaginal bleeding, discharge, pelvic pain, problems with intercourse or other gynecologic concerns.     Social  Relationship: married  Living: with husband and children Works: Multimedia programmer to go Exercise: none  Smoke/alcohol/drugs: stopped smoking 2 wks ago, rare alcohols.   Gynecologic History Patient's last menstrual period was 04/25/2020 (exact date). Contraception: pull out method   Upstream - 05/22/20 0906      Pregnancy Intention Screening   Does the patient want to become pregnant in the next year? Yes    Does the patient's partner want to become pregnant in the next year? Yes    Would the patient like to discuss contraceptive options today? No      Contraception Wrap Up   Contraception Counseling Provided No           Last Pap: 02/2017. Results were: normal Last mammogram: n/a .  Obstetric History OB History  Gravida Para Term Preterm AB Living  4 4 4     4   SAB TAB Ectopic Multiple Live Births          4    # Outcome Date GA Lbr Len/2nd Weight Sex Delivery Anes PTL Lv  4 Term 09/20/17 [redacted]w[redacted]d  6 lb 1 oz (2.75 kg) F Vag-Spont  N LIV  3 Term 05/20/13 [redacted]w[redacted]d  6 lb 1.6 oz (2.767 kg) F Vag-Spont   LIV  2 Term 09/21/09 [redacted]w[redacted]d  6 lb 2.2 oz (2.785 kg) M Vag-Spont   LIV  1 Term 11/24/07 [redacted]w[redacted]d  7 lb 1.6 oz (3.221 kg) F Vag-Spont   LIV    No past medical history on file.  Past Surgical History:  Procedure Laterality Date  . TONSILLECTOMY    . TONSILLECTOMY      Current Outpatient Medications on File Prior to Visit  Medication Sig Dispense Refill  . metroNIDAZOLE (FLAGYL) 500 MG tablet Take 1 tablet (500 mg total) by mouth 2 (two) times daily. 14 tablet 0   No current facility-administered medications on file prior to visit.    Allergies  Allergen  Reactions  . Naproxen Rash    Social History:  reports that she has quit smoking. She smoked 0.50 packs per day. She has never used smokeless tobacco. She reports current alcohol use. She reports that she does not use drugs.  Family History  Problem Relation Age of Onset  . Diabetes Mother   . Cancer Mother        cervical  . Seizures Brother   . Cancer Maternal Aunt   . Cancer Maternal Grandmother     The following portions of the patient's history were reviewed and updated as appropriate: allergies, current medications, past family history, past medical history, past social history, past surgical history and problem list.  Review of Systems Pertinent items noted in HPI and remainder of comprehensive ROS otherwise negative.  Physical Exam:  LMP 04/25/2020 (Exact Date)  CONSTITUTIONAL: Well-developed, well-nourished female in no acute distress.  HENT:  Normocephalic, atraumatic, External right and left ear normal. Oropharynx is clear and moist EYES: Conjunctivae and EOM are normal. Pupils are equal, round, and reactive to light. No scleral icterus.  NECK: Normal range of motion, supple, no masses.  Normal thyroid.  SKIN: Skin is warm and dry. No rash noted. Not  diaphoretic. No erythema. No pallor. MUSCULOSKELETAL: Normal range of motion. No tenderness.  No cyanosis, clubbing, or edema.  2+ distal pulses. NEUROLOGIC: Alert and oriented to person, place, and time. Normal reflexes, muscle tone coordination.  PSYCHIATRIC: Normal mood and affect. Normal behavior. Normal judgment and thought content. CARDIOVASCULAR: Normal heart rate noted, regular rhythm RESPIRATORY: Clear to auscultation bilaterally. Effort and breath sounds normal, no problems with respiration noted. BREASTS: Symmetric in size. No masses, tenderness, skin changes, nipple drainage, or lymphadenopathy bilaterally. ABDOMEN: Soft, no distention noted.  No tenderness, rebound or guarding.  PELVIC: Normal appearing external  genitalia and urethral meatus; normal appearing vaginal mucosa and cervix.  No abnormal discharge noted.  Pap smear obtained.  Normal uterine size, no other palpable masses, no uterine or adnexal tenderness.    Assessment and Plan:    1. Women's annual routine gynecological examination Will follow up results of pap smear and manage accordingly. Mammogram not indicated Labs:Hep C, Lipid profile  Refills : none Referrals:none Considering getting pregnant at begninnig of year. Discussed preconception diet, exercise, PNV.  Routine preventative health maintenance measures emphasized. Please refer to After Visit Summary for other counseling recommendations.      Philip Aspen, CNM  [facility]

## 2020-05-23 LAB — LIPID PANEL
Chol/HDL Ratio: 3.2 ratio (ref 0.0–4.4)
Cholesterol, Total: 143 mg/dL (ref 100–199)
HDL: 45 mg/dL
LDL Chol Calc (NIH): 81 mg/dL (ref 0–99)
Triglycerides: 88 mg/dL (ref 0–149)
VLDL Cholesterol Cal: 17 mg/dL (ref 5–40)

## 2020-05-23 LAB — HEPATITIS C ANTIBODY: Hep C Virus Ab: <0.1 s/co ratio (ref 0.0–0.9)

## 2020-06-04 ENCOUNTER — Telehealth: Payer: Self-pay

## 2020-06-04 LAB — CYTOLOGY - PAP
Comment: NEGATIVE
Comment: NEGATIVE
Diagnosis: HIGH — AB
HPV 16: NEGATIVE
HPV 18 / 45: POSITIVE — AB
High risk HPV: POSITIVE — AB
Molecular Amendment: DETECTED

## 2020-06-04 NOTE — Telephone Encounter (Signed)
Received a call from [place] at [facility] to report patients pap smear detected HPV. States the cytologist signed off before the report was complete.

## 2020-07-12 ENCOUNTER — Encounter: Payer: Medicaid Other | Admitting: Obstetrics and Gynecology

## 2020-08-08 ENCOUNTER — Encounter: Payer: Self-pay | Admitting: Obstetrics and Gynecology

## 2020-08-08 ENCOUNTER — Other Ambulatory Visit: Payer: Self-pay

## 2020-08-08 ENCOUNTER — Ambulatory Visit (INDEPENDENT_AMBULATORY_CARE_PROVIDER_SITE_OTHER): Payer: Medicaid Other | Admitting: Obstetrics and Gynecology

## 2020-08-08 ENCOUNTER — Other Ambulatory Visit (HOSPITAL_COMMUNITY)
Admission: RE | Admit: 2020-08-08 | Discharge: 2020-08-08 | Disposition: A | Discharge status: Home / Self Care | Payer: Medicaid Other | Source: Ambulatory Visit | Attending: Obstetrics and Gynecology | Admitting: Obstetrics and Gynecology

## 2020-08-08 VITALS — BP 84/55 | HR 66 | Ht 64.0 in | Wt 129.2 lb

## 2020-08-08 DIAGNOSIS — R87613 High grade squamous intraepithelial lesion on cytologic smear of cervix (HGSIL): Secondary | ICD-10-CM | POA: Diagnosis not present

## 2020-08-08 DIAGNOSIS — R87611 Atypical squamous cells cannot exclude high grade squamous intraepithelial lesion on cytologic smear of cervix (ASC-H): Secondary | ICD-10-CM | POA: Diagnosis present

## 2020-08-08 NOTE — Patient Instructions (Signed)

## 2020-08-08 NOTE — Progress Notes (Signed)
     GYNECOLOGY OFFICE COLPOSCOPY PROCEDURE NOTE  29 y.o. [id number] here for colposcopy for ASC cannot exclude high grade lesion [facility]) pap smear on 05/22/2020. Discussed role for HPV in cervical dysplasia, need for surveillance. Patient denies h/o abnormal pap smears in the past.   Patient gave informed written consent, time out was performed.  Placed in lithotomy position. Cervix viewed with speculum and colposcope after application of acetic acid.   Colposcopy adequate? Yes, with use of endocervical spreader.   no mosaicism, no punctation, no abnormal vasculature and faint acetowhite lesion(s) noted at 9 o'clock; corresponding biopsy obtained.  ECC specimen obtained. All specimens were labeled and sent to pathology.  Patient was given post procedure instructions.  Will follow up pathology and manage accordingly; patient will be contacted with results and recommendations.  Routine preventative health maintenance measures emphasized.    Rubie Maid, MD [facility]

## 2020-08-08 NOTE — Progress Notes (Signed)
Pt present for colpo. Pt stated that she was doing well no problems.  

## 2020-08-08 NOTE — Addendum Note (Signed)
Addended by: Edwyna Shell on: 08/08/2020 12:04 PM   Modules accepted: Orders

## 2020-08-09 LAB — SURGICAL PATHOLOGY

## 2020-08-28 ENCOUNTER — Ambulatory Visit (INDEPENDENT_AMBULATORY_CARE_PROVIDER_SITE_OTHER): Payer: Self-pay | Admitting: Obstetrics and Gynecology

## 2020-08-28 ENCOUNTER — Encounter: Payer: Self-pay | Admitting: Obstetrics and Gynecology

## 2020-08-28 ENCOUNTER — Other Ambulatory Visit: Payer: Self-pay

## 2020-08-28 VITALS — BP 113/72 | HR 59 | Ht 64.0 in | Wt 133.0 lb

## 2020-08-28 DIAGNOSIS — Z8049 Family history of malignant neoplasm of other genital organs: Secondary | ICD-10-CM

## 2020-08-28 DIAGNOSIS — N871 Moderate cervical dysplasia: Secondary | ICD-10-CM

## 2020-08-28 NOTE — Progress Notes (Signed)
Pt present to discuss options for having a +HPV on her pap smear.

## 2020-08-28 NOTE — Progress Notes (Signed)
GYNECOLOGY PROGRESS NOTE  Subjective:    Patient ID: Melanie York, female    DOB: [birth year], 29 y.o.   MRN: [id number]  HPI  Patient is a 29 y.o. [id number] female who presents for discussion of colposcopy biopsy results.  Patient with a history of ASC-H pap smear performed 05/22/2020.  She had a colposcopy performed on 08/08/2020 with biopsies performed.  Denies any major complaints today.   The following portions of the patient's history were reviewed and updated as appropriate: allergies, current medications, past family history, past medical history, past social history, past surgical history and problem list.  Review of Systems Pertinent items noted in HPI and remainder of comprehensive ROS otherwise negative.   Objective:   Blood pressure 113/72, pulse (!) 59, height 5\' 4"  (1.626 m), weight 133 lb (60.3 kg), last menstrual period 07/31/2020. General appearance: alert and no distress Remainder of exam deferred.    Pathology:  Procedure visit on 08/08/2020  Component Date Value Ref Range Status  . SURGICAL PATHOLOGY 08/08/2020    Final-Edited                   Value:SURGICAL PATHOLOGY CASE: [id number] PATIENT: [name] Surgical Pathology Report     Clinical History: ASC-H (cm)     FINAL MICROSCOPIC DIAGNOSIS:  A. ENDOCERVIX, CURETTAGE: - Low grade squamous intraepithelial lesion, CIN-I (mild dysplasia).  B. CERVIX, 12 O'CLOCK, BIOPSY: - High grade squamous intraepithelial lesion, CIN-II (moderate dysplasia).   GROSS DESCRIPTION:  A.  Received in formalin is blood tinged mucus that is entirely submitted in one block. Volume: 1.7 x 1.5 x 0.3 cm (1 B)  B.  Received in formalin are tan, soft tissue fragments that are submitted in toto. Number: 1 with additional minute fragments Size: < 0.1 to 0.6 cm Blocks: 1 (AK 08/08/2020)   Final Diagnosis performed by Gillie Manners, MD.   Electronically signed 08/09/2020 Technical and / or Professional  components performed at [employer]. [facility], [place] [address] [place], [place] [address].  Immunohistochemistry Technical component (if applicable) was per                         formed at [facility]. [address], [place], [place], [place] [address].   IMMUNOHISTOCHEMISTRY DISCLAIMER (if applicable): Some of these immunohistochemical stains may have been developed and the performance characteristics determine by [facility]. Some may not have been cleared or approved by the U.S. Food and Drug Administration. The FDA has determined that such clearance or approval is not necessary. This test is used for clinical purposes. It should not be regarded as investigational or for research. This laboratory is certified under the [place] (CLIA-88) as qualified to perform high complexity clinical laboratory testing.  The controls stained appropriately.     Assessment:   CIN I- II Family history of cervical cancer  Plan:   1. Discussed biopsy results with patient. Moderate cervical dysplasia present.  Discussion had that we will follow ASCCP guidelines and manage accordingly based on this pathology. Discussion had regarding if any concerns present for future pregnancies.  Patient denies any concerns this time.  Based on guidelines, with current age and biopsy results, and no concerns for future pregnancies, excisional procedure is recommended.  Discussed LEEP procedure, including risks, benefits. Alternatives to procedure discussed including observation and repeating colposcopy with pap at 6 and 12 months (however typically performed if  concerns for future fertility are noted). Patient notes she would desires intervention due to family history of cervical cancer in mother and possibly grandmother. Will schedule in the next several weeks, after the holiday. Given handout to remove.    A total of 15 minutes were  spent face-to-face with the patient during this encounter and over half of that time dealt with counseling and coordination of care.   Rubie Maid, MD [facility]

## 2020-08-28 NOTE — Patient Instructions (Signed)
Loop Electrosurgical Excision Procedure Loop electrosurgical excision procedure (LEEP) is the cutting and removal (excision) of tissue from the cervix. The cervix is the bottom part of the uterus that opens into the vagina. The tissue that is removed from the cervix is examined to see if there are precancerous cells or cancer cells present. LEEP may be done when:  You have abnormal bleeding from your cervix.  You have an abnormal Pap test result.  Your health care provider finds an abnormality on your cervix during a pelvic exam. LEEP typically only takes a few minutes and is often done in the health care provider's office. The procedure is safe for women who are trying to get pregnant. However, the procedure is usually not done during a menstrual period or during pregnancy. Tell a health care provider about:  Any allergies you have.  All medicines you are taking, including vitamins, herbs, eye drops, creams, and over-the-counter medicines.  Any blood disorders you have.  Any medical conditions you have, including current or past vaginal infections such as herpes or sexually-transmitted infections (STIs).  Whether you are pregnant or may be pregnant.  Whether or not you are having vaginal bleeding on the day of the procedure. What are the risks? Generally, this is a safe procedure. However, problems may occur, including:  Infection.  Bleeding.  Allergic reactions to medicines.  Changes or scarring in the cervix.  Increased risk of early (preterm) labor in future pregnancies. What happens before the procedure?  Ask your health care provider about: ? Changing or stopping your regular medicines. This is especially important if you are taking diabetes medicines or blood thinners. ? Taking medicines such as aspirin and ibuprofen. These medicines can thin your blood. Do not take these medicines unless your health care provider tells you to take them. ? Taking over-the-counter  medicines, vitamins, herbs, and supplements.  Your health care provider may recommend that you take pain medicine before the procedure.  Ask your health care provider if you should plan to have someone take you home after the procedure. What happens during the procedure?   An instrument called a speculum will be placed in your vagina. This will allow your health care provider to see your cervix.  You will be given a medicine to numb the area (local anesthetic). The medicine will be injected into your cervix and the surrounding area.  A solution will be applied to your cervix. This solution will help the health care provider find the abnormal cells that need to be removed.  A thin wire loop will be passed through your vagina. The wire will be used to burn (cauterize) the cervical tissue with an electrical current.  You may feel faint during the procedure. Tell your health care provider right away if you feel this way.  The abnormal cervical tissue will be removed.  Any open blood vessels will be cauterized to prevent bleeding.  A paste may be applied to the cauterized area of your cervix to help prevent bleeding.  The sample of cervical tissue will be examined under a microscope. The procedure may vary among health care providers and hospitals. What can I expect after the procedure? After the procedure, it is common to have:  Mild abdominal cramps that are similar to menstrual cramps. These may last for up to 1 week.  A small amount of pink-tinged or bloody vaginal discharge, including light to moderate bleeding, for 1-2 weeks.  A dark-colored discharge coming from your vagina. This is from   the paste that was used on the cervix to prevent bleeding. It is up to you to get the results of your procedure. Ask your health care provider, or the department that is doing the procedure, when your results will be ready. Follow these instructions at home:  Take over-the-counter and  prescription medicines only as told by your health care provider.  Return to your normal activities as told by your health care provider. Ask your health care provider what activities are safe for you.  Do not put anything in your vagina for 2 weeks after the procedure or until your health care provider says that it is okay. This includes tampons, creams, and douches.  Do not have sex until your health care provider approves.  Keep all follow-up visits as told by your health care provider. This is important. Contact a health care provider if you:  Have a fever or chills.  Feel unusually weak.  Have vaginal bleeding that is heavier or longer than a normal menstrual cycle. A sign of this can be soaking a pad with blood or bleeding with clots.  Develop a bad smelling vaginal discharge.  Have severe abdominal pain or cramping. Summary  Loop electrosurgical excision procedure (LEEP) is the removal of tissue from the cervix. The removed tissue will be checked for precancerous cells or cancer cells.  LEEP typically only takes a few minutes and is often done in the health care provider's office.  Do not put anything in your vagina for 2 weeks after the procedure or until your health care provider says that it is okay. This includes tampons, creams, and douches.  Keep all follow-up visits as told by your health care provider. Ask your health care provider, or the department that is doing the procedure, when your results will be ready. This information is not intended to replace advice given to you by your health care provider. Make sure you discuss any questions you have with your health care provider. Document Revised: 10/01/2018 Document Reviewed: 10/01/2018 Elsevier Patient Education  2020 Elsevier Inc.  

## 2020-09-18 ENCOUNTER — Telehealth: Payer: Self-pay

## 2020-09-18 NOTE — Telephone Encounter (Signed)
Patient called in stating that she wants to have her LEEP in an OR setting rather than an office setting. Could you please advise?

## 2020-09-20 NOTE — Telephone Encounter (Signed)
Spoke to pt concerning her call to the office. Informed pt that I would send her message to Dr. Valentino Saxon and OR scheduler. Pt was advised that someone would be contacting her in 48-72 hours. Pt voiced that she was okay with that.

## 2020-09-20 NOTE — Telephone Encounter (Signed)
Please advise. Thanks Ramiah Helfrich 

## 2020-09-21 NOTE — Telephone Encounter (Signed)
Ok Thanks, °

## 2020-09-21 NOTE — Telephone Encounter (Signed)
Yes please.  I guess we can talk about how she will need to get this covered.

## 2020-09-27 ENCOUNTER — Encounter: Payer: Self-pay | Admitting: Obstetrics and Gynecology

## 2020-09-27 ENCOUNTER — Ambulatory Visit (INDEPENDENT_AMBULATORY_CARE_PROVIDER_SITE_OTHER): Payer: Self-pay | Admitting: Obstetrics and Gynecology

## 2020-09-27 ENCOUNTER — Other Ambulatory Visit: Payer: Self-pay

## 2020-09-27 ENCOUNTER — Inpatient Hospital Stay: Admission: RE | Admit: 2020-09-27 | Payer: Medicaid Other | Source: Ambulatory Visit

## 2020-09-27 VITALS — BP 110/73 | HR 64 | Ht 64.0 in | Wt 130.4 lb

## 2020-09-27 DIAGNOSIS — N871 Moderate cervical dysplasia: Secondary | ICD-10-CM

## 2020-09-27 NOTE — Progress Notes (Signed)
Pt present to discuss LEEP procedure at the hospital. Pt stated that she was doing well no problems.

## 2020-09-27 NOTE — Patient Instructions (Addendum)
You are scheduled for surgery on 10/01/2020.  Nothing to eat after midnight on day prior to surgery.  Do not take any medications unless recommended by your provider on day prior to surgery.  Do not take NSAIDs (Motrin, Aleve) or aspirin 7 days prior to surgery.  You may take Tylenol products for minor aches and pains.  You will receive a prescription for pain medications post-operatively.  You will be contacted by phone within 1 week prior to surgery to schedule pre-operative appointment.  Please call the office if you have any questions regarding your upcoming surgery.        COVID 19 Instructions for Scheduled Procedure (Inductions/C-sections and GYN surgeries)   Thank you for choosing [facility] for your services.  You have been scheduled for a procedure called _________LEEP_____________________.    Your procedure is scheduled on _________Monday, January 10, 2022___________.  You are required to have COVID-19 testing performed 2 days prior to your scheduled procedure date.  Testing is performed between 9 AM and 1 PM Monday through Friday.  Please present for testing on ___Friday, January 7, 2022________ during this hour. Drive up testing is performed in front of the [facility] (this is next to the [employer]).    Upon your scheduled procedure date, you will need to arrive at the [facility] entrance. (There is a statue at the front of this entrance.)   Please arrive on time if you are scheduled for an induction of labor.   If you are scheduled for a Cesarean delivery or for Gyn Surgery, arrive 2 hours prior to your procedure time.   If you are an Obstetric patient and your arrival time falls between 11 PM and 6 AM call L&D [phone]) when you arrive.  A staff member will meet you at the [facility] entrance.  At this time, patients are allowed 1 support person to accompany them. Face masks are required for you and your support person. Your support person  is now allowed to be there with you during the entire time of your admission.   Please contact the office if you have any questions regarding this information.  The [facility] office number is [phone].     Thank you,    Your [facility]

## 2020-09-27 NOTE — Telephone Encounter (Signed)
Pt will be seen by Zenon Mayo to get her medicaid forms completed at 8am on 1/10.

## 2020-09-27 NOTE — Progress Notes (Signed)
GYNECOLOGY PREOPERATIVE HISTORY AND PHYSICAL  Subjective:  Melanie York is a 30 y.o. [id number] here for pre-operative appointment.  Planned procedure is LEEP.   Indications for procedure include: severe cervical dysplasia. She had initially planned for in-office procedure, however has now changed her mind and desires to proceed with surgery in the OR. No significant preoperative concerns. Has no complaints today.   Patient with a history of ASC-H pap smear performed 05/22/2020.  She had a colposcopy performed on 08/08/2020 with biopsies performed.   Pertinent Gynecological History: Menses: regular every month without intermenstrual spotting.  Patient's last menstrual period was 09/25/2020. Contraception: coitus interruptus    History reviewed. No pertinent past medical history.    Past Surgical History:  Procedure Laterality Date  . TONSILLECTOMY      OB History  Gravida Para Term Preterm AB Living  4 4 4     4   SAB IAB Ectopic Multiple Live Births          4    # Outcome Date GA Lbr Len/2nd Weight Sex Delivery Anes PTL Lv  4 Term 09/20/17 [redacted]w[redacted]d  6 lb 1 oz (2.75 kg) F Vag-Spont  N LIV  3 Term 05/20/13 [redacted]w[redacted]d  6 lb 1.6 oz (2.767 kg) F Vag-Spont   LIV  2 Term 09/21/09 [redacted]w[redacted]d  6 lb 2.2 oz (2.785 kg) M Vag-Spont   LIV  1 Term 11/24/07 [redacted]w[redacted]d  7 lb 1.6 oz (3.221 kg) F Vag-Spont   LIV     Family History  Problem Relation Age of Onset  . Diabetes Mother   . Cancer Mother        cervical  . Seizures Brother   . Cancer Maternal Aunt   . Cancer Maternal Grandmother     Social History   Socioeconomic History  . Marital status: Single    Spouse name: Not on file  . Number of children: 4  . Years of education: Not on file  . Highest education level: Not on file  Occupational History  . Not on file  Tobacco Use  . Smoking status: Former Smoker    Packs/day: 0.50  . Smokeless tobacco: Never Used  Vaping Use  . Vaping Use: Never used  Substance and Sexual Activity  .  Alcohol use: Yes    Comment: occ. before pregnancy  . Drug use: No  . Sexual activity: Yes    Partners: Male    Birth control/protection: None  Other Topics Concern  . Not on file  Social History Narrative  . Not on file   Social Determinants of Health   Financial Resource Strain: Not on file  Food Insecurity: Not on file  Transportation Needs: Not on file  Physical Activity: Not on file  Stress: Not on file  Social Connections: Not on file  Intimate Partner Violence: Not on file   No current outpatient medications on file prior to visit.   No current facility-administered medications on file prior to visit.    Allergies  Allergen Reactions  . Naproxen Rash      Review of Systems Constitutional: No recent fever/chills/sweats Respiratory: No recent cough/bronchitis Cardiovascular: No chest pain Gastrointestinal: No recent nausea/vomiting/diarrhea Genitourinary: No UTI symptoms Hematologic/lymphatic:No history of coagulopathy or recent blood thinner use    Objective:   Blood pressure 110/73, pulse 64, height 5\' 4"  (1.626 m), weight 130 lb 6.4 oz (59.1 kg), last menstrual period 09/25/2020. CONSTITUTIONAL: Well-developed, well-nourished female in no acute distress.  HENT:  Normocephalic,  atraumatic, External right and left ear normal. Oropharynx is clear and moist EYES: Conjunctivae and EOM are normal. Pupils are equal, round, and reactive to light. No scleral icterus.  NECK: Normal range of motion, supple, no masses SKIN: Skin is warm and dry. No rash noted. Not diaphoretic. No erythema. No pallor. NEUROLOGIC: Alert and oriented to person, place, and time. Normal reflexes, muscle tone coordination. No cranial nerve deficit noted. PSYCHIATRIC: Normal mood and affect. Normal behavior. Normal judgment and thought content. CARDIOVASCULAR: Normal heart rate noted, regular rhythm RESPIRATORY: Effort and breath sounds normal, no problems with respiration noted ABDOMEN:  Soft, nontender, nondistended. PELVIC: Deferred MUSCULOSKELETAL: Normal range of motion. No edema and no tenderness. 2+ distal pulses.    Labs: Procedure visit on 08/08/2020  Component Date Value Ref Range Status  . SURGICAL PATHOLOGY 08/08/2020    Final-Edited                   Value:SURGICAL PATHOLOGY CASE: [id number] PATIENT: Rudi Coco Surgical Pathology Report     Clinical History: ASC-H (cm)     FINAL MICROSCOPIC DIAGNOSIS:  A. ENDOCERVIX, CURETTAGE: - Low grade squamous intraepithelial lesion, CIN-I (mild dysplasia).  B. CERVIX, 12 O'CLOCK, BIOPSY: - High grade squamous intraepithelial lesion, CIN-II (moderate dysplasia).   GROSS DESCRIPTION:  A.  Received in formalin is blood tinged mucus that is entirely submitted in one block. Volume: 1.7 x 1.5 x 0.3 cm (1 B)  B.  Received in formalin are tan, soft tissue fragments that are submitted in toto. Number: 1 with additional minute fragments Size: < 0.1 to 0.6 cm Blocks: 1 (AK 08/08/2020)   Final Diagnosis performed by Consuello Bossier, MD.   Electronically signed 08/09/2020 Technical and / or Professional components performed at [employer]. [facility], [address] [place], [place] [address].  Immunohistochemistry Technical component (if applicable) was per                         formed at [facility] [address], [place], [place] [address].   IMMUNOHISTOCHEMISTRY DISCLAIMER (if applicable): Some of these immunohistochemical stains may have been developed and the performance characteristics determine by [facility]. Some may not have been cleared or approved by the U.S. Food and Drug Administration. The FDA has determined that such clearance or approval is not necessary. This test is used for clinical purposes. It should not be regarded as investigational or for research. This laboratory is certified under the Clinical Laboratory Improvement  Amendments of 1988 (CLIA-88) as qualified to perform high complexity clinical laboratory testing.  The controls stained appropriately.      Imaging Studies: No results found.  Assessment:    Moderate dysplasia   Plan:    Counseling: Procedure, risks, reasons, benefits and complications (including injury to bowel, bladder, major blood vessel, bleeding, possibility of transfusion, infection, or fistula formation) reviewed in detail. Also discussed the risk of cervical stenosis and cervical incompetency after procedure with regards to future fertility. Likelihood of success in alleviating the patient's condition was discussed. Routine postoperative instructions will be reviewed with the patient and her family in detail after surgery.  The patient concurred with the proposed plan.  Preop testing ordered. Instructions reviewed, including NPO after midnight. For COVID pre-op testing tomorrow.    Hildred Laser, MD [facility] 09/27/2020 12:50 PM

## 2020-09-28 ENCOUNTER — Other Ambulatory Visit
Admission: RE | Admit: 2020-09-28 | Discharge: 2020-09-28 | Disposition: A | Discharge status: Home / Self Care | Payer: Medicaid Other | Source: Ambulatory Visit | Attending: Obstetrics and Gynecology | Admitting: Obstetrics and Gynecology

## 2020-09-28 DIAGNOSIS — Z20822 Contact with and (suspected) exposure to covid-19: Secondary | ICD-10-CM | POA: Insufficient documentation

## 2020-09-28 DIAGNOSIS — Z01812 Encounter for preprocedural laboratory examination: Secondary | ICD-10-CM | POA: Diagnosis present

## 2020-09-28 LAB — SARS CORONAVIRUS 2 (TAT 6-24 HRS): SARS Coronavirus 2: NEGATIVE

## 2020-10-01 ENCOUNTER — Ambulatory Visit
Admission: RE | Admit: 2020-10-01 | Discharge: 2020-10-01 | Disposition: A | Discharge status: Home / Self Care | Payer: Medicaid Other | Attending: Obstetrics and Gynecology | Admitting: Obstetrics and Gynecology

## 2020-10-01 ENCOUNTER — Encounter
Admission: RE | Disposition: A | Discharge status: Home / Self Care | Payer: Self-pay | Source: Home / Self Care | Attending: Obstetrics and Gynecology

## 2020-10-01 ENCOUNTER — Ambulatory Visit: Payer: Medicaid Other | Admitting: Anesthesiology

## 2020-10-01 ENCOUNTER — Ambulatory Visit: Payer: Self-pay | Attending: Oncology | Admitting: *Deleted

## 2020-10-01 ENCOUNTER — Encounter: Payer: Self-pay | Admitting: Obstetrics and Gynecology

## 2020-10-01 ENCOUNTER — Other Ambulatory Visit: Payer: Self-pay

## 2020-10-01 DIAGNOSIS — Z809 Family history of malignant neoplasm, unspecified: Secondary | ICD-10-CM | POA: Insufficient documentation

## 2020-10-01 DIAGNOSIS — Z8049 Family history of malignant neoplasm of other genital organs: Secondary | ICD-10-CM | POA: Diagnosis not present

## 2020-10-01 DIAGNOSIS — Z87891 Personal history of nicotine dependence: Secondary | ICD-10-CM | POA: Diagnosis not present

## 2020-10-01 DIAGNOSIS — N871 Moderate cervical dysplasia: Secondary | ICD-10-CM

## 2020-10-01 DIAGNOSIS — Z886 Allergy status to analgesic agent status: Secondary | ICD-10-CM | POA: Diagnosis not present

## 2020-10-01 HISTORY — PX: LEEP: SHX91

## 2020-10-01 LAB — CBC
HCT: 37.5 % (ref 36.0–46.0)
Hemoglobin: 12.7 g/dL (ref 12.0–15.0)
MCH: 32.2 pg (ref 26.0–34.0)
MCHC: 33.9 g/dL (ref 30.0–36.0)
MCV: 94.9 fL (ref 80.0–100.0)
Platelets: 219 10*3/uL (ref 150–400)
RBC: 3.95 MIL/uL (ref 3.87–5.11)
RDW: 11.9 % (ref 11.5–15.5)
WBC: 6 10*3/uL (ref 4.0–10.5)
nRBC: 0 % (ref 0.0–0.2)

## 2020-10-01 LAB — POCT PREGNANCY, URINE: Preg Test, Ur: NEGATIVE

## 2020-10-01 SURGERY — LEEP (LOOP ELECTROSURGICAL EXCISION PROCEDURE)
Anesthesia: General

## 2020-10-01 MED ORDER — MEPERIDINE HCL 50 MG/ML IJ SOLN: 6.2500 mg | INTRAMUSCULAR | Status: DC | PRN

## 2020-10-01 MED ORDER — IODINE STRONG (LUGOLS) 5 % PO SOLN
ORAL | Status: DC | PRN
  Administered 2020-10-01: 0.2 mL

## 2020-10-01 MED ORDER — DROPERIDOL 2.5 MG/ML IJ SOLN
0.6250 mg | Freq: Once | INTRAMUSCULAR | Status: DC | PRN
  Filled 2020-10-01: qty 2

## 2020-10-01 MED ORDER — LIDOCAINE-EPINEPHRINE 1 %-1:100000 IJ SOLN
INTRAMUSCULAR | Status: DC | PRN
  Administered 2020-10-01: 10 mL

## 2020-10-01 MED ORDER — MIDAZOLAM HCL 2 MG/2ML IJ SOLN
INTRAMUSCULAR | Status: AC
  Filled 2020-10-01: qty 2

## 2020-10-01 MED ORDER — MIDAZOLAM HCL 2 MG/2ML IJ SOLN
INTRAMUSCULAR | Status: DC | PRN
  Administered 2020-10-01: 2 mg via INTRAVENOUS

## 2020-10-01 MED ORDER — PROMETHAZINE HCL 25 MG/ML IJ SOLN: 6.2500 mg | INTRAMUSCULAR | Status: DC | PRN

## 2020-10-01 MED ORDER — LIDOCAINE HCL (PF) 1 % IJ SOLN
INTRAMUSCULAR | Status: AC
  Filled 2020-10-01: qty 30

## 2020-10-01 MED ORDER — HYDROMORPHONE HCL 1 MG/ML IJ SOLN
0.2500 mg | INTRAMUSCULAR | Status: DC | PRN
  Administered 2020-10-01: 0.25 mg via INTRAVENOUS

## 2020-10-01 MED ORDER — ONDANSETRON HCL 4 MG/2ML IJ SOLN
INTRAMUSCULAR | Status: DC | PRN
  Administered 2020-10-01: 4 mg via INTRAVENOUS

## 2020-10-01 MED ORDER — PROPOFOL 10 MG/ML IV BOLUS
INTRAVENOUS | Status: DC | PRN
  Administered 2020-10-01: 200 mg via INTRAVENOUS

## 2020-10-01 MED ORDER — PROPOFOL 10 MG/ML IV BOLUS
INTRAVENOUS | Status: AC
  Filled 2020-10-01: qty 40

## 2020-10-01 MED ORDER — POVIDONE-IODINE 10 % EX SWAB: 2.0000 "application " | Freq: Once | CUTANEOUS | Status: DC

## 2020-10-01 MED ORDER — FERRIC SUBSULFATE 259 MG/GM EX SOLN
CUTANEOUS | Status: DC | PRN
  Administered 2020-10-01: 1 via TOPICAL

## 2020-10-01 MED ORDER — LORAZEPAM 2 MG/ML IJ SOLN: 1.0000 mg | Freq: Once | INTRAMUSCULAR | Status: DC | PRN

## 2020-10-01 MED ORDER — HYDROMORPHONE HCL 1 MG/ML IJ SOLN
INTRAMUSCULAR | Status: AC
  Administered 2020-10-01: 0.25 mg via INTRAVENOUS
  Filled 2020-10-01: qty 1

## 2020-10-01 MED ORDER — FENTANYL CITRATE (PF) 100 MCG/2ML IJ SOLN
INTRAMUSCULAR | Status: AC
  Filled 2020-10-01: qty 2

## 2020-10-01 MED ORDER — ACETAMINOPHEN 500 MG PO TABS
1000.0000 mg | ORAL_TABLET | ORAL | Status: AC
  Administered 2020-10-01: 1000 mg via ORAL

## 2020-10-01 MED ORDER — IODINE STRONG (LUGOLS) 5 % PO SOLN
ORAL | Status: AC
  Filled 2020-10-01: qty 1

## 2020-10-01 MED ORDER — LIDOCAINE-EPINEPHRINE 1 %-1:100000 IJ SOLN
INTRAMUSCULAR | Status: AC
  Filled 2020-10-01: qty 2

## 2020-10-01 MED ORDER — OXYCODONE HCL 5 MG/5ML PO SOLN: 5.0000 mg | Freq: Once | ORAL | Status: DC | PRN

## 2020-10-01 MED ORDER — LIDOCAINE HCL (CARDIAC) PF 100 MG/5ML IV SOSY
PREFILLED_SYRINGE | INTRAVENOUS | Status: DC | PRN
  Administered 2020-10-01: 100 mg via INTRAVENOUS

## 2020-10-01 MED ORDER — LACTATED RINGERS IV SOLN: INTRAVENOUS | Status: DC

## 2020-10-01 MED ORDER — FERRIC SUBSULFATE 259 MG/GM EX SOLN
CUTANEOUS | Status: AC
  Filled 2020-10-01: qty 8

## 2020-10-01 MED ORDER — CHLORHEXIDINE GLUCONATE 0.12 % MT SOLN
OROMUCOSAL | Status: AC
  Administered 2020-10-01: 15 mL via OROMUCOSAL
  Filled 2020-10-01: qty 15

## 2020-10-01 MED ORDER — ACETAMINOPHEN 500 MG PO TABS
ORAL_TABLET | ORAL | Status: AC
  Filled 2020-10-01: qty 2

## 2020-10-01 MED ORDER — CHLORHEXIDINE GLUCONATE 0.12 % MT SOLN: 15.0000 mL | Freq: Once | OROMUCOSAL | Status: AC

## 2020-10-01 MED ORDER — DEXAMETHASONE SODIUM PHOSPHATE 10 MG/ML IJ SOLN
INTRAMUSCULAR | Status: DC | PRN
  Administered 2020-10-01: 6 mg via INTRAVENOUS

## 2020-10-01 MED ORDER — ACETAMINOPHEN 500 MG PO TABS
1000.0000 mg | ORAL_TABLET | Freq: Four times a day (QID) | ORAL | 0 refills | Status: DC | PRN
Start: 2020-10-01 — End: 2021-04-30

## 2020-10-01 MED ORDER — OXYCODONE HCL 5 MG PO TABS: 5.0000 mg | ORAL_TABLET | Freq: Once | ORAL | Status: DC | PRN

## 2020-10-01 SURGICAL SUPPLY — 31 items
APPLICATOR COTTON TIP 6IN STRL (MISCELLANEOUS) ×2 IMPLANT
APPLICATOR SWAB PROCTO LG 16IN (MISCELLANEOUS) ×4 IMPLANT
CANISTER SUCT 1200ML W/VALVE (MISCELLANEOUS) ×2 IMPLANT
CATH ROBINSON RED A/P 16FR (CATHETERS) ×2 IMPLANT
COVER WAND RF STERILE (DRAPES) ×2 IMPLANT
DRAPE UNDER BUTTOCK W/FLU (DRAPES) ×2 IMPLANT
DRSG TELFA 3X8 NADH (GAUZE/BANDAGES/DRESSINGS) ×2 IMPLANT
ELECT LEEP LOOP 1.0CM .7CM (MISCELLANEOUS) ×2
ELECT LEEP LOOP 20X10 R2010 (MISCELLANEOUS) ×2
ELECT LOOP 1.0X1.0CM R1010 (MISCELLANEOUS) ×2
ELECT REM PT RETURN 9FT ADLT (ELECTROSURGICAL) ×2
ELECTRODE LEEP LOOP 1.0CM .7CM (MISCELLANEOUS) ×1 IMPLANT
ELECTRODE LEP LOOP 20X10 R2010 (MISCELLANEOUS) ×1 IMPLANT
ELECTRODE LOOP 1.0X1.0CM R1010 (MISCELLANEOUS) ×1 IMPLANT
ELECTRODE REM PT RTRN 9FT ADLT (ELECTROSURGICAL) ×1 IMPLANT
GLOVE INDICATOR 7.0 STRL GRN (GLOVE) ×2 IMPLANT
GLOVE SURG ENC MOIS LTX SZ6.5 (GLOVE) ×2 IMPLANT
GOWN STRL REUS W/ TWL LRG LVL3 (GOWN DISPOSABLE) ×2 IMPLANT
GOWN STRL REUS W/TWL LRG LVL3 (GOWN DISPOSABLE) ×4
HANDLE YANKAUER SUCT BULB TIP (MISCELLANEOUS) ×2 IMPLANT
KIT TURNOVER CYSTO (KITS) ×2 IMPLANT
LABEL OR SOLS (LABEL) ×2 IMPLANT
MANIFOLD NEPTUNE II (INSTRUMENTS) ×2 IMPLANT
NEEDLE SPNL 22GX3.5 QUINCKE BK (NEEDLE) ×2 IMPLANT
PACK DNC HYST (MISCELLANEOUS) ×2 IMPLANT
PAD PREP 24X41 OB/GYN DISP (PERSONAL CARE ITEMS) ×2 IMPLANT
PENCIL ELECTRO HAND CTR (MISCELLANEOUS) ×2 IMPLANT
SOL PREP PVP 2OZ (MISCELLANEOUS) ×2
SOLUTION PREP PVP 2OZ (MISCELLANEOUS) ×1 IMPLANT
STRAW SMOKE EVAC LEEP 6150 NON (MISCELLANEOUS) ×2 IMPLANT
SYR 10ML LL (SYRINGE) ×2 IMPLANT

## 2020-10-01 NOTE — Op Note (Signed)
Procedure(s): LOOP ELECTROSURGICAL EXCISION PROCEDURE (LEEP) Procedure Note  Melanie York female 30 y.o. 10/01/2020  Indications: The patient is a 30 y.o. [id number] female prior ASC-H pap, with moderate cervical dysplasia (CIN II) on colposcopic biopsy.   Pre-operative Diagnosis: Moderate cervical dysplasia (CIN II)  Post-operative Diagnosis: Same  Surgeon: Rubie Maid, MD  Assistants: None  Anesthesia: General endotracheal anesthesia  Findings: Normal appearing cervix, with areas of decreased area of uptake noted from 9-11 o'clock.   Procedure Details: The patient was seen in the Holding Room. The risks, benefits, complications, treatment options, and expected outcomes were discussed with the patient.  The patient concurred with the proposed plan, giving informed consent.  The site of surgery properly noted/marked. The patient was taken to the Operating Room, identified as Melanie York [facility] and the procedure verified as Procedure(s) (LRB):LOOP ELECTROSURGICAL EXCISION PROCEDURE (LEEP) (N/A).   She was then placed under general anesthesia without difficulty. She was placed in the dorsal lithotomy position, and was prepped and draped in a sterile manner.  A straight catheterization was performed. A bivalved coated speculum was placed in the patient's vagina. A grounding pad placed on the patient. Lugol's solution was applied to the cervix and areas of decreased uptake were noted around the transformation zone.   Local anesthesia was administered via an intracervical block using 10 ml of 0.5% Sensorcaine with epinephrine. The suction was turned on and the Medium 1X Fisher Cone Biopsy Excisor on 76 Watts of blended current was used to excise the area of decreased uptake and excise the entire transformation zone. An endocervical curettage was performed. Excellent hemostasis was achieved using roller ball coagulation set at 60 Watts coagulation current. Monsel's solution was then applied and the  speculum was removed from the vagina. Specimens were sent to pathology.  The patient tolerated the procedure well.  The patient tolerated the procedures well.  All instruments, needles, and sponge counts were correct x 2. The patient was taken to the recovery room awake, extubated and in stable condition.   Estimated Blood Loss:  10 ml      Drains: straight catheterization prior to procedure with 50 ml of clear urine         Total IV Fluids:  600 ml  Specimens: Ectocervix, tagged at 12 o'clock.  Endocervical curettage         Implants: None         Complications:  None; patient tolerated the procedure well.         Disposition: PACU - hemodynamically stable.         Condition: stable   Rubie Maid, MD [facility]

## 2020-10-01 NOTE — Anesthesia Procedure Notes (Signed)
Procedure Name: LMA Insertion Date/Time: 10/01/2020 11:45 AM Performed by: Lowry Bowl, CRNA Pre-anesthesia Checklist: Emergency Drugs available, Patient identified, Suction available and Patient being monitored Patient Re-evaluated:Patient Re-evaluated prior to induction Oxygen Delivery Method: Circle system utilized Preoxygenation: Pre-oxygenation with 100% oxygen Induction Type: IV induction Ventilation: Mask ventilation without difficulty LMA: LMA inserted LMA Size: 3.5 Number of attempts: 1 Placement Confirmation: positive ETCO2 and breath sounds checked- equal and bilateral Tube secured with: Tape Dental Injury: Teeth and Oropharynx as per pre-operative assessment

## 2020-10-01 NOTE — Anesthesia Postprocedure Evaluation (Signed)
Anesthesia Post Note  Patient: Melanie York  Procedure(s) Performed: LOOP ELECTROSURGICAL EXCISION PROCEDURE (LEEP) (N/A )  Patient location during evaluation: PACU Anesthesia Type: General Level of consciousness: awake Pain management: pain level controlled Vital Signs Assessment: post-procedure vital signs reviewed and stable Respiratory status: spontaneous breathing Cardiovascular status: blood pressure returned to baseline and stable Postop Assessment: no apparent nausea or vomiting Anesthetic complications: no   No complications documented.   Last Vitals:  Vitals:   10/01/20 1303 10/01/20 1309  BP: 98/63 99/67  Pulse: (!) 57 (!) 58  Resp: 15 17  Temp:    SpO2: 100% 100%    Last Pain:  Vitals:   10/01/20 1309  TempSrc:   PainSc: 8                  Neva Seat

## 2020-10-01 NOTE — Discharge Instructions (Addendum)
AMBULATORY SURGERY  DISCHARGE INSTRUCTIONS   1) The drugs that you were given will stay in your system until tomorrow so for the next 24 hours you should not:  A) Drive an automobile B) Make any legal decisions C) Drink any alcoholic beverage   2) You may resume regular meals tomorrow.  Today it is better to start with liquids and gradually work up to solid foods.  You may eat anything you prefer, but it is better to start with liquids, then soup and crackers, and gradually work up to solid foods.   3) Please notify your doctor immediately if you have any unusual bleeding, trouble breathing, redness and pain at the surgery site, drainage, fever, or pain not relieved by medication.    4) Additional Instructions:        Please contact your physician with any problems or Same Day Surgery at [phone], Monday through Friday 6 am to 4 pm, or [place] [facility] number at [phone]. Loop Electrosurgical Excision Procedure Loop electrosurgical excision procedure (LEEP) is the cutting and removal (excision) of tissue from the cervix. The cervix is the bottom part of the uterus that opens into the vagina. The tissue that is removed from the cervix is examined to see if there are precancerous cells or cancer cells present. LEEP may be done when:  You have abnormal bleeding from your cervix.  You have an abnormal Pap test result.  Your health care provider finds an abnormality on your cervix during a pelvic exam. LEEP typically only takes a few minutes and is often done in the health care provider's office. The procedure is safe for women who are trying to get pregnant. However, the procedure is usually not done during a menstrual period or during pregnancy. Tell a health care provider about:  Any allergies you have.  All medicines you are taking, including vitamins, herbs, eye drops, creams, and over-the-counter medicines.  Any blood disorders you have.  Any medical  conditions you have, including current or past vaginal infections such as herpes or sexually-transmitted infections (STIs).  Whether you are pregnant or may be pregnant.  Whether or not you are having vaginal bleeding on the day of the procedure. What are the risks? Generally, this is a safe procedure. However, problems may occur, including:  Infection.  Bleeding.  Allergic reactions to medicines.  Changes or scarring in the cervix.  Increased risk of early (preterm) labor in future pregnancies. What happens before the procedure?  Ask your health care provider about: ? Changing or stopping your regular medicines. This is especially important if you are taking diabetes medicines or blood thinners. ? Taking medicines such as aspirin and ibuprofen. These medicines can thin your blood. Do not take these medicines unless your health care provider tells you to take them. ? Taking over-the-counter medicines, vitamins, herbs, and supplements.  Your health care provider may recommend that you take pain medicine before the procedure.  Ask your health care provider if you should plan to have someone take you home after the procedure. What happens during the procedure?  An instrument called a speculum will be placed in your vagina. This will allow your health care provider to see your cervix.  You will be given a medicine to numb the area (local anesthetic). The medicine will be injected into your cervix and the surrounding area.  A solution will be applied to your cervix. This solution will help the health care provider find the abnormal cells that need  to be removed.  A thin wire loop will be passed through your vagina. The wire will be used to burn (cauterize) the cervical tissue with an electrical current.  You may feel faint during the procedure. Tell your health care provider right away if you feel this way.  The abnormal cervical tissue will be removed.  Any open blood vessels  will be cauterized to prevent bleeding.  A paste may be applied to the cauterized area of your cervix to help prevent bleeding.  The sample of cervical tissue will be examined under a microscope. The procedure may vary among health care providers and hospitals.   What can I expect after the procedure? After the procedure, it is common to have:  Mild abdominal cramps that are similar to menstrual cramps. These may last for up to 1 week.  A small amount of pink-tinged or bloody vaginal discharge, including light to moderate bleeding, for 1-2 weeks.  A dark-colored discharge coming from your vagina. This is from the paste that was used on the cervix to prevent bleeding. It is up to you to get the results of your procedure. Ask your health care provider, or the department that is doing the procedure, when your results will be ready. Follow these instructions at home:  Take over-the-counter and prescription medicines only as told by your health care provider.  Return to your normal activities as told by your health care provider. Ask your health care provider what activities are safe for you.  Do not put anything in your vagina for 3 weeks after the procedure or until your health care provider says that it is okay. This includes tampons, creams, and douches.  Do not have sex until your health care provider approves.  Keep all follow-up visits as told by your health care provider. This is important. Contact a health care provider if you:  Have a fever or chills.  Feel unusually weak.  Have vaginal bleeding that is heavier or longer than a normal menstrual cycle. A sign of this can be soaking a pad with blood or bleeding with clots.  Develop a bad smelling vaginal discharge.  Have severe abdominal pain or cramping. Summary  Loop electrosurgical excision procedure (LEEP) is the removal of tissue from the cervix. The removed tissue will be checked for precancerous cells or cancer  cells.  LEEP typically only takes a few minutes and is often done in the health care provider's office.  Do not put anything in your vagina for 2 weeks after the procedure or until your health care provider says that it is okay. This includes tampons, creams, and douches.  Keep all follow-up visits as told by your health care provider. Ask your health care provider, or the department that is doing the procedure, when your results will be ready. This information is not intended to replace advice given to you by your health care provider. Make sure you discuss any questions you have with your health care provider. Document Revised: 10/01/2018 Document Reviewed: 10/01/2018 Elsevier Patient Education  2021 [employer].

## 2020-10-01 NOTE — Transfer of Care (Signed)
Immediate Anesthesia Transfer of Care Note  Patient: Melanie York  Procedure(s) Performed: LOOP ELECTROSURGICAL EXCISION PROCEDURE (LEEP) (N/A )  Patient Location: PACU  Anesthesia Type:General  Level of Consciousness: awake, drowsy and patient cooperative  Airway & Oxygen Therapy: Patient Spontanous Breathing  Post-op Assessment: Report given to RN and Post -op Vital signs reviewed and stable  Post vital signs: Reviewed and stable  Last Vitals:  Vitals Value Taken Time  BP 103/73 10/01/20 1233  Temp 36.8 C 10/01/20 1233  Pulse 85 10/01/20 1233  Resp 18 10/01/20 1233  SpO2 100 % 10/01/20 1233  Vitals shown include unvalidated device data.  Last Pain:  Vitals:   10/01/20 0928  TempSrc: Oral  PainSc: 0-No pain         Complications: No complications documented.

## 2020-10-01 NOTE — Progress Notes (Signed)
30 year old female referred to [facility] by Dr. 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  Patient's last pap on 05/22/20 showed HPV 18/45+ ASCUS.  Colposcopy and biopsy showed CIN11.  It was recommended that she have a LEEP for treatment.  Patient consented and enrolled into our [facility]CCCP program.  Patient has been screened for eligibility.  She does not have any insurance, Medicare or Medicaid.  She also meets financial eligibility.  BCCCP Medicaid forms completed, signed and sent to Everardo Pacific, SW at [facility] in [place]. Patient informed to let providers know that Medicaid is pending.

## 2020-10-01 NOTE — H&P (Signed)
GYNECOLOGY PREOPERATIVE HISTORY AND PHYSICAL  Subjective:  Melanie York is a 30 y.o. [id number] presents for LEEP procedure.   Indications for procedure include: moderate cervical dysplasia (CIN II). She had initially planned for in-office procedure, however has now changed her mind and desires to proceed with surgery in the OR. No significant preoperative concerns. Has no complaints today.   Patient with a history of ASC-H pap smear performed 05/22/2020.  She had a colposcopy performed on 08/08/2020 with biopsies performed.   Pertinent Gynecological History: Menses: regular every month without intermenstrual spotting.  Patient's last menstrual period was 09/25/2020. Contraception: coitus interruptus    History reviewed. No pertinent past medical history.    Past Surgical History:  Procedure Laterality Date  . TONSILLECTOMY      OB History  Gravida Para Term Preterm AB Living  4 4 4     4   SAB IAB Ectopic Multiple Live Births          4    # Outcome Date GA Lbr Len/2nd Weight Sex Delivery Anes PTL Lv  4 Term 09/20/17 [redacted]w[redacted]d  2750 g F Vag-Spont  N LIV  3 Term 05/20/13 [redacted]w[redacted]d  2767 g F Vag-Spont   LIV  2 Term 09/21/09 [redacted]w[redacted]d  2785 g M Vag-Spont   LIV  1 Term 11/24/07 [redacted]w[redacted]d  3221 g F Vag-Spont   LIV     Family History  Problem Relation Age of Onset  . Diabetes Mother   . Cancer Mother        cervical  . Seizures Brother   . Cancer Maternal Aunt   . Cancer Maternal Grandmother     Social History   Socioeconomic History  . Marital status: Single    Spouse name: Not on file  . Number of children: 4  . Years of education: Not on file  . Highest education level: Not on file  Occupational History  . Not on file  Tobacco Use  . Smoking status: Former Smoker    Packs/day: 0.50  . Smokeless tobacco: Never Used  Vaping Use  . Vaping Use: Never used  Substance and Sexual Activity  . Alcohol use: Yes    Comment: occ. before pregnancy  . Drug use: No  . Sexual  activity: Yes    Partners: Male    Birth control/protection: None  Other Topics Concern  . Not on file  Social History Narrative  . Not on file   Social Determinants of Health   Financial Resource Strain: Not on file  Food Insecurity: Not on file  Transportation Needs: Not on file  Physical Activity: Not on file  Stress: Not on file  Social Connections: Not on file  Intimate Partner Violence: Not on file   No current facility-administered medications on file prior to encounter.   No current outpatient medications on file prior to encounter.    Allergies  Allergen Reactions  . Naproxen Rash      Review of Systems Constitutional: No recent fever/chills/sweats Respiratory: No recent cough/bronchitis Cardiovascular: No chest pain Gastrointestinal: No recent nausea/vomiting/diarrhea Genitourinary: No UTI symptoms Hematologic/lymphatic:No history of coagulopathy or recent blood thinner use    Objective:   Blood pressure 107/72, pulse 62, temperature (!) 97.4 F (36.3 C), temperature source Oral, resp. rate 16, height 5\' 4"  (1.626 m), weight 59 kg, last menstrual period 09/25/2020, SpO2 100 %. CONSTITUTIONAL: Well-developed, well-nourished female in no acute distress.  HENT:  Normocephalic, atraumatic, External right and left ear normal. Oropharynx  is clear and moist EYES: Conjunctivae and EOM are normal. Pupils are equal, round, and reactive to light. No scleral icterus.  NECK: Normal range of motion, supple, no masses SKIN: Skin is warm and dry. No rash noted. Not diaphoretic. No erythema. No pallor. NEUROLOGIC: Alert and oriented to person, place, and time. Normal reflexes, muscle tone coordination. No cranial nerve deficit noted. PSYCHIATRIC: Normal mood and affect. Normal behavior. Normal judgment and thought content. CARDIOVASCULAR: Normal heart rate noted, regular rhythm RESPIRATORY: Effort and breath sounds normal, no problems with respiration noted ABDOMEN: Soft,  nontender, nondistended. PELVIC: Deferred MUSCULOSKELETAL: Normal range of motion. No edema and no tenderness. 2+ distal pulses.    Labs: Procedure visit on 08/08/2020  Component Date Value Ref Range Status   . SURGICAL PATHOLOGY 08/08/2020    Final-Edited                   Value:SURGICAL PATHOLOGY CASE: [id number] PATIENT: Melanie York Surgical Pathology Report     Clinical History: ASC-H (cm)     FINAL MICROSCOPIC DIAGNOSIS:  A. ENDOCERVIX, CURETTAGE: - Low grade squamous intraepithelial lesion, CIN-I (mild dysplasia).  B. CERVIX, 12 O'CLOCK, BIOPSY: - High grade squamous intraepithelial lesion, CIN-II (moderate dysplasia).   GROSS DESCRIPTION:  A.  Received in formalin is blood tinged mucus that is entirely submitted in one block. Volume: 1.7 x 1.5 x 0.3 cm (1 B)  B.  Received in formalin are tan, soft tissue fragments that are submitted in toto. Number: 1 with additional minute fragments Size: < 0.1 to 0.6 cm Blocks: 1 (AK 08/08/2020)   Final Diagnosis performed by Gillie Manners, MD.   Electronically signed 08/09/2020 Technical and / or Professional components performed at [employer]. [facility], [place] [address] [place], [place] [address].  Immunohistochemistry Technical component (if applicable) was per                         formed at [facility]. [address] [place], [place], [place] [address].   IMMUNOHISTOCHEMISTRY DISCLAIMER (if applicable): Some of these immunohistochemical stains may have been developed and the performance characteristics determine by [facility]. Some may not have been cleared or approved by the U.S. Food and Drug Administration. The FDA has determined that such clearance or approval is not necessary. This test is used for clinical purposes. It should not be regarded as investigational or for research. This laboratory is certified under the [place] (CLIA-88) as qualified to perform high complexity clinical laboratory testing.  The controls stained appropriately.        Hospital Outpatient Visit on 09/28/2020  Component Date Value Ref Range Status  . SARS Coronavirus 2 09/28/2020 NEGATIVE  NEGATIVE Final   Comment: (NOTE) SARS-CoV-2 target nucleic acids are NOT DETECTED.  The SARS-CoV-2 RNA is generally detectable in upper and lower respiratory specimens during the acute phase of infection. Negative results do not preclude SARS-CoV-2 infection, do not rule out co-infections with other pathogens, and should not be used as the sole basis for treatment or other patient management decisions. Negative results must be combined with clinical observations, patient history, and epidemiological information. The expected result is Negative.  Fact Sheet for Patients: SugarRoll.be  Fact Sheet for Healthcare Providers: https://www.woods-mathews.com/  This test is not yet approved or cleared by the [place] FDA and  has been authorized for detection and/or diagnosis of SARS-CoV-2 by FDA under an Emergency Use  Authorization (EUA). This EUA will remain  in effect (meaning this test can be used) for the duration of the COVID-19 declaration under Se                          ction 564(b)(1) of the Act, 21 U.S.C. section 360bbb-3(b)(1), unless the authorization is terminated or revoked sooner.  Performed at [facility], [place] [address], [place], [place] [address]      Imaging Studies: No results found.  Assessment:    Moderate dysplasia   Plan:    Counseling: Procedure, risks, reasons, benefits and complications (including injury to bowel, bladder, major blood vessel, bleeding, possibility of transfusion, infection, or fistula formation) reviewed in detail. Also discussed the risk of cervical stenosis and cervical incompetency after procedure  with regards to future fertility. Likelihood of success in alleviating the patient's condition was discussed. Routine postoperative instructions will be reviewed with the patient and her family in detail after surgery.  The patient concurred with the proposed plan.  Preop testing reviewed. NPO since midnight.    Rubie Maid, MD [facility] 10/01/2020 11:29 AM

## 2020-10-01 NOTE — Anesthesia Preprocedure Evaluation (Signed)
Anesthesia Evaluation  Patient identified by MRN, date of birth, ID band Patient awake    Reviewed: Allergy & Precautions, H&P , NPO status , Patient's Chart, lab work & pertinent test results  Airway Mallampati: II   Neck ROM: Full    Dental no notable dental hx.    Pulmonary neg pulmonary ROS, former smoker,    Pulmonary exam normal        Cardiovascular negative cardio ROS Normal cardiovascular exam Rhythm:Regular Rate:Normal     Neuro/Psych negative neurological ROS  negative psych ROS   GI/Hepatic negative GI ROS, Neg liver ROS,   Endo/Other  negative endocrine ROS  Renal/GU negative Renal ROS  negative genitourinary   Musculoskeletal negative musculoskeletal ROS (+)   Abdominal   Peds negative pediatric ROS (+)  Hematology negative hematology ROS (+)   Anesthesia Other Findings History reviewed. No pertinent past medical history.   Reproductive/Obstetrics negative OB ROS                             Anesthesia Physical Anesthesia Plan  ASA: I  Anesthesia Plan: General   Post-op Pain Management:    Induction: Intravenous  PONV Risk Score and Plan: 3 and Ondansetron and Dexamethasone  Airway Management Planned: LMA  Additional Equipment:   Intra-op Plan:   Post-operative Plan: Extubation in OR  Informed Consent: I have reviewed the patients History and Physical, chart, labs and discussed the procedure including the risks, benefits and alternatives for the proposed anesthesia with the patient or authorized representative who has indicated his/her understanding and acceptance.       Plan Discussed with: CRNA, Anesthesiologist and Surgeon  Anesthesia Plan Comments:         Anesthesia Quick Evaluation

## 2020-10-02 ENCOUNTER — Encounter: Payer: Self-pay | Admitting: Obstetrics and Gynecology

## 2020-10-03 LAB — SURGICAL PATHOLOGY

## 2020-10-25 ENCOUNTER — Encounter: Payer: Self-pay | Admitting: Obstetrics and Gynecology

## 2020-10-25 ENCOUNTER — Other Ambulatory Visit: Payer: Self-pay

## 2020-10-25 ENCOUNTER — Ambulatory Visit (INDEPENDENT_AMBULATORY_CARE_PROVIDER_SITE_OTHER): Payer: Medicaid Other | Admitting: Obstetrics and Gynecology

## 2020-10-25 VITALS — BP 98/63 | HR 79 | Ht 64.0 in | Wt 129.1 lb

## 2020-10-25 DIAGNOSIS — N871 Moderate cervical dysplasia: Secondary | ICD-10-CM

## 2020-10-25 DIAGNOSIS — Z9889 Other specified postprocedural states: Secondary | ICD-10-CM

## 2020-10-25 DIAGNOSIS — Z4889 Encounter for other specified surgical aftercare: Secondary | ICD-10-CM

## 2020-10-25 NOTE — Progress Notes (Signed)
Pt present for follow up from LEEP procedure. Pt stated having watery discharge with foul odor x 3 days.

## 2020-10-25 NOTE — Patient Instructions (Addendum)
HPV and Cancer Information HPV (human papillomavirus)is a very common virus that spreads easily from person to person through skin-to-skin or sexual contact. There are many types of HPV. It often does not cause symptoms. However, depending upon the type, it may sometimes cause warts in the genitals (genital or mucosal HPV), or on the hands or feet (cutaneous or nonmucosal HPV). It is possible to be infected for a long time and pass HPV to others without knowing it. Some HPV infections go away on their own within 2 years, but other HPV infections are considered high-risk and may cause changes in cells that could lead to cancer. You can take steps to avoid HPV infection and to lower your risk of getting cancer. How can HPV affect me? HPV can cause warts in the genitals or on the hands or feet. It can also cause wart-like lesions in the throat.  Certain types of genital HPV can also cause cancer, which may include:  Cervical cancer.  Vaginal cancer.  Vulvar cancer.  Anal cancer.  Throat cancer.  Tongue or mouth cancer.  Penile cancer. How does HPV spread? HPV spreads easily through direct person to person contact. Genital HPV spreads through sexual contact. You can get HPV from vaginal sex, oral sex, anal sex, or just by touching someone's genitals. Even people who have only one sexual partner may have HPV because that partner may have it. HPV often does not cause symptoms, so most infected people do not know that they have it. What actions can I take to prevent HPV? Take the following steps to help prevent HPV infection:  Talk with your health care provider about getting the HPV vaccine. This vaccine protects against the types of HPV that could cause cancer.  Limit the number of people you have sex with. Also, avoid having sex with people who have had many sexual partners.  Use a condom during sex.  Talk with your sexual partners about their health.   What actions can I take to lower my  risk for cancer? Having a healthy lifestyle and taking some preventive steps can help lower your cancer risk, whether or not you have genital HPV. Some steps you can take include: Lifestyle  Practice safe sex to help prevent HPV infection.  Do not use any products that contain nicotine or tobacco, such as cigarettes, e-cigarettes, and chewing tobacco. If you need help quitting, ask your health care provider.  Eat foods that have antioxidants, such as fruits, vegetables, and grains. Try to eat at least 5 servings of fruits and vegetables every day.  Get regular exercise.  Lose weight if you are overweight.  Practice good oral hygiene. This includes flossing and brushing your teeth every day. Other preventive steps  Get the HPV vaccine as told by your health care provider.  Get tested for STIs even if you do not have symptoms of HPV. You may have HPV and not know it.  If you are a woman, get regular Pap and HPV tests. Talk with your health care provider about how often you need these tests. Pap tests will help identify changes in cells that can lead to cancer. HPV tests will help identify the presence of HPV in cells in the cervix. Where to find more information Learn more about HPV and cancer from:  Centers for Disease Control and Prevention: http://sweeney-todd.com/  [place]: www.cancer.gov  American Cancer Society: www.cancer.org Contact a health care provider if:  You have genital warts.  You are sexually  active and think you may have HPV.  You did not protect yourself during sex and would like to be tested for STIs. Summary  Human papillomavirus (HPV) is a very common virus that spreads easily from person to person and ishighly contagious.  Certain types of genital HPV are considered to be high risk and may cause changes in cells that could lead to cancer.  You should take steps to avoid HPV infection, such as limiting the number of people you have sex with,  using condoms during sex, and getting the HPV vaccine.  Lifestyle changes can help lower your risk of cancer. These include eating a healthy diet, getting regular exercise, and not using any products that contain nicotine or tobacco.  You may have HPV and not know it. Get tested for STIs even if you do not have symptoms of HPV. If you are a woman, have regular Pap tests and HPV tests as directed by your health care provider. This information is not intended to replace advice given to you by your health care provider. Make sure you discuss any questions you have with your health care provider. Document Revised: 04/24/2020 Document Reviewed: 04/24/2020 Elsevier Patient Education  2021 [place].   HPV (Human Papillomavirus) Vaccine: What You Need to Know 1. Why get vaccinated? HPV (human papillomavirus) vaccine can prevent infection with some types of human papillomavirus. HPV infections can cause certain types of cancers, including:  cervical, vaginal, and vulvar cancers in women  penile cancer in men  anal cancers in both men and women  cancers of tonsils, base of tongue, and back of throat (oropharyngeal cancer) in both men and women HPV infections can also cause anogenital warts. HPV vaccine can prevent over 90% of cancers caused by HPV. HPV is spread through intimate skin-to-skin or sexual contact. HPV infections are so common that nearly all people will get at least one type of HPV at some time in their lives. Most HPV infections go away on their own within 2 years. But sometimes HPV infections will last longer and can cause cancers later in life. 2. HPV vaccine HPV vaccine is routinely recommended for adolescents at 59 or 30 years of age to ensure they are protected before they are exposed to the virus. HPV vaccine may be given beginning at age 18 years and vaccination is recommended for everyone through 31 years of age. HPV vaccine may be given to adults 50 through 31 years of  age, based on discussions between the patient and health care provider. Most children who get the first dose before 62 years of age need 2 doses of HPV vaccine. People who get the first dose at or after 53 years of age and younger people with certain immunocompromising conditions need 3 doses. Your health care provider can give you more information. HPV vaccine may be given at the same time as other vaccines. 3. Talk with your health care provider Tell your vaccination provider if the person getting the vaccine:  Has had an allergic reaction after a previous dose of HPV vaccine, or has any severe, life-threatening allergies  Is pregnant-HPV vaccine is not recommended until after pregnancy In some cases, your health care provider may decide to postpone HPV vaccination until a future visit. People with minor illnesses, such as a cold, may be vaccinated. People who are moderately or severely ill should usually wait until they recover before getting HPV vaccine. Your health care provider can give you more information. 4. Risks of a vaccine  reaction  Soreness, redness, or swelling where the shot is given can happen after HPV vaccination.  Fever or headache can happen after HPV vaccination. People sometimes faint after medical procedures, including vaccination. Tell your provider if you feel dizzy or have vision changes or ringing in the ears. As with any medicine, there is a very remote chance of a vaccine causing a severe allergic reaction, other serious injury, or death. 5. What if there is a serious problem? An allergic reaction could occur after the vaccinated person leaves the clinic. If you see signs of a severe allergic reaction (hives, swelling of the face and throat, difficulty breathing, a fast heartbeat, dizziness, or weakness), call 9-1-1 and get the person to the nearest hospital. For other signs that concern you, call your health care provider. Adverse reactions should be reported to  the Vaccine Adverse Event Reporting System (VAERS). Your health care provider will usually file this report, or you can do it yourself. Visit the VAERS website at www.vaers.SamedayNews.es or call (548) 754-3696. VAERS is only for reporting reactions, and VAERS staff members do not give medical advice. 6. The National Vaccine Injury Compensation Program The [employer] Vaccine Injury Compensation Program (VICP) is a federal program that was created to compensate people who may have been injured by certain vaccines. Claims regarding alleged injury or death due to vaccination have a time limit for filing, which may be as short as two years. Visit the VICP website at GoldCloset.com.ee or call (279)184-7478 to learn about the program and about filing a claim. 7. How can I learn more?  Ask your health care provider.  Call your local or state health department.  Visit the website of the Food and Drug Administration (FDA) for vaccine package inserts and additional information at TraderRating.uy.  Contact the Centers for Disease Control and Prevention (CDC): ? Call (936)229-0553 (1-800-CDC-INFO) or ? Visit CDC's website at http://hunter.com/. Vaccine Information Statement HPV Vaccine (04/27/2020) This information is not intended to replace advice given to you by your health care provider. Make sure you discuss any questions you have with your health care provider. Document Revised: 06/05/2020 Document Reviewed: 06/05/2020 Elsevier Patient Education  2021 [employer].

## 2020-10-25 NOTE — Progress Notes (Signed)
    OBSTETRICS/GYNECOLOGY POST-OPERATIVE CLINIC VISIT  Subjective:     Melanie York is a 30 y.o. female who presents to the clinic 3 weeks status post LEEP  for moderate cervical dysplasia (CIN II). Eating a regular diet without difficulty. Bowel movements are normal. The patient is not having any pain.  Patient with a history of ASC-H pap smear performed 05/22/2020. 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   The following portions of the patient's history were reviewed and updated as appropriate: allergies, current medications, past family history, past medical history, past social history, past surgical history and problem list.  Review of Systems Pertinent items noted in HPI and remainder of comprehensive ROS otherwise negative.    Objective:    BP 98/63   Pulse 79   Ht 5\' 4"  (1.626 m)   Wt 129 lb 1.6 oz (58.6 kg)   LMP 10/22/2020   BMI 22.16 kg/m  General:  alert and no distress  Abdomen: soft, bowel sounds active, non-tender  Incision:   healing well, scant thin yellow discharge in vaginal vault, no odor. Cervix with minimal post-LEEP changes.     Pathology (10/01/2020):   A. UTERINE CERVIX; LOOP ELECTROSURGICAL EXCISION PROCEDURE:  - HIGH-GRADE SQUAMOUS INTRAEPITHELIAL LESION (CIN-2, MODERATE  DYSPLASIA), INVOLVING 3-6:00, 6-9:00, AND 9-12:00 QUADRANTS.  - NEGATIVE FOR INVASIVE CARCINOMA.  - SURGICAL MARGINS FREE OF DYSPLASIA.   Assessment:   Post-op check s/p LEEP procedure CIN II   Plan:   1. Doing well post-operatively.   2. Wound care discussed. Advised on pelvic rest x 1 additional week.  3. Operative findings again reviewed. Pathology report discussed. 4. Activity restrictions: none  5. Anticipated return to work: has already returned. . 6. Follow up: 6 months for repeat pap smear.  If normal, will need pap smears annually x 3 years, and then can return routine q  3-5 year screening.  7. Can consider HPV vaccine if she has not previously received.     Rubie Maid, MD [facility]

## 2021-01-31 ENCOUNTER — Emergency Department: Payer: Medicaid Other

## 2021-01-31 ENCOUNTER — Other Ambulatory Visit: Payer: Self-pay

## 2021-01-31 ENCOUNTER — Emergency Department
Admission: EM | Admit: 2021-01-31 | Discharge: 2021-01-31 | Disposition: A | Discharge status: Home / Self Care | Payer: Medicaid Other | Attending: Emergency Medicine | Admitting: Emergency Medicine

## 2021-01-31 DIAGNOSIS — M545 Low back pain, unspecified: Secondary | ICD-10-CM | POA: Diagnosis not present

## 2021-01-31 DIAGNOSIS — Z87891 Personal history of nicotine dependence: Secondary | ICD-10-CM | POA: Insufficient documentation

## 2021-01-31 MED ORDER — MORPHINE SULFATE (PF) 4 MG/ML IV SOLN
4.0000 mg | Freq: Once | INTRAVENOUS | Status: AC
  Administered 2021-01-31: 4 mg via INTRAVENOUS
  Filled 2021-01-31: qty 1

## 2021-01-31 MED ORDER — PREDNISONE 10 MG (21) PO TBPK: ORAL_TABLET | ORAL | 0 refills | Status: DC

## 2021-01-31 MED ORDER — METHYLPREDNISOLONE SODIUM SUCC 125 MG IJ SOLR
125.0000 mg | Freq: Once | INTRAMUSCULAR | Status: AC
  Administered 2021-01-31: 125 mg via INTRAVENOUS
  Filled 2021-01-31: qty 2

## 2021-01-31 MED ORDER — METHOCARBAMOL 500 MG PO TABS: 500.0000 mg | ORAL_TABLET | Freq: Three times a day (TID) | ORAL | 0 refills | Status: AC | PRN

## 2021-01-31 MED ORDER — ONDANSETRON HCL 4 MG/2ML IJ SOLN
4.0000 mg | Freq: Once | INTRAMUSCULAR | Status: AC
  Administered 2021-01-31: 4 mg via INTRAVENOUS
  Filled 2021-01-31: qty 2

## 2021-01-31 NOTE — Discharge Instructions (Addendum)
Take tapered prednisone as directed. You can take Robaxin up to three times daily for the next five days.

## 2021-01-31 NOTE — ED Provider Notes (Signed)
[facility]-EMERGENCY DEPARTMENT  ____________________________________________  Time seen: Approximately 8:46 PM  I have reviewed the triage vital signs and the nursing notes.   HISTORY  Chief Complaint Back Pain   Historian Patient     HPI Melanie York is a 30 y.o. female presenting via EMS to the emergency department with sharp and stabbing low back pain.  Patient reports that she was at her place of work[facility] [employer] when she reached down to lift up a keg.  Patient states that she felt a pop and excruciating pain and felt like she could not move her legs.  Patient states that she has developed tingling in her feet and states that she cannot bear weight.  No bowel or bladder incontinence or saddle anesthesia.   No past medical history on file.   Immunizations up to date:  Yes.     No past medical history on file.  Patient Active Problem List   Diagnosis Date Noted  . CIN II (cervical intraepithelial neoplasia II) 08/28/2020  . Dyspareunia, female 05/07/2020  . Constipation   . History of marijuana use 03/09/2017    Past Surgical History:  Procedure Laterality Date  . LEEP N/A 10/01/2020   Procedure: LOOP ELECTROSURGICAL EXCISION PROCEDURE (LEEP);  Surgeon: Rubie Maid, MD;  Location: [facility] ORS;  Service: Gynecology;  Laterality: N/A;  . TONSILLECTOMY    . TONSILLECTOMY      Prior to Admission medications   Medication Sig Start Date End Date Taking? Authorizing Provider  methocarbamol (ROBAXIN) 500 MG tablet Take 1 tablet (500 mg total) by mouth every 8 (eight) hours as needed for up to 5 days. 01/31/21 02/05/21 Yes Vallarie Mare M, PA-C  predniSONE (STERAPRED UNI-PAK 21 TAB) 10 MG (21) TBPK tablet Take 6 tablets the first day, take 5 tablets the second day, take 4 tablets the third day, take 3 tablets the fourth day, take 2 tablets the fifth day, take 1 tablet the sixth day. 01/31/21  Yes Vallarie Mare M, PA-C  acetaminophen (TYLENOL) 500 MG tablet Take 2 tablets (1,000 mg  total) by mouth every 6 (six) hours as needed. 10/01/20 10/01/21  Rubie Maid, MD    Allergies Naproxen  Family History  Problem Relation Age of Onset  . Diabetes Mother   . Cancer Mother        cervical  . Seizures Brother   . Cancer Maternal Aunt   . Cancer Maternal Grandmother     Social History Social History   Tobacco Use  . Smoking status: Former Smoker    Packs/day: 0.50  . Smokeless tobacco: Never Used  Vaping Use  . Vaping Use: Never used  Substance Use Topics  . Alcohol use: Yes    Comment: occ. before pregnancy  . Drug use: No     Review of Systems  Constitutional: No fever/chills Eyes:  No discharge ENT: No upper respiratory complaints. Respiratory: no cough. No SOB/ use of accessory muscles to breath Gastrointestinal:   No nausea, no vomiting.  No diarrhea.  No constipation. Musculoskeletal: Patient has low back pain.  Skin: Negative for rash, abrasions, lacerations, ecchymosis.    ____________________________________________   PHYSICAL EXAM:  VITAL SIGNS: ED Triage Vitals  Enc Vitals Group     BP 01/31/21 2022 118/76     Pulse Rate 01/31/21 2022 90     Resp 01/31/21 2022 18     Temp 01/31/21 2022 98.1 F (36.7 C)     Temp Source 01/31/21 2022 Oral  SpO2 01/31/21 2022 98 %     Weight 01/31/21 2023 130 lb (59 kg)     Height 01/31/21 2023 5\' 4"  (1.626 m)     Head Circumference --      Peak Flow --      Pain Score 01/31/21 2023 10     Pain Loc --      Pain Edu? --      Excl. in [place]? --      Constitutional: Alert and oriented. Well appearing and in no acute distress. Eyes: Conjunctivae are normal. PERRL. EOMI. Head: Atraumatic. ENT:      Nose: No congestion/rhinnorhea.      Mouth/Throat: Mucous membranes are moist.  Neck: No stridor.  No cervical spine tenderness to palpation. Cardiovascular: Normal rate, regular rhythm. Normal S1 and S2.  Good peripheral circulation. Respiratory: Normal respiratory effort without tachypnea or  retractions. Lungs CTAB. Good air entry to the bases with no decreased or absent breath sounds Gastrointestinal: Bowel sounds x 4 quadrants. Soft and nontender to palpation. No guarding or rigidity. No distention. Musculoskeletal: Full range of motion to all extremities. No obvious deformities noted.  Patient has midline lumbar spine tenderness at L4. Neurologic:  Normal for age. No gross focal neurologic deficits are appreciated.  Skin:  Skin is warm, dry and intact. No rash noted. Psychiatric: Mood and affect are normal for age. Speech and behavior are normal.   ____________________________________________   LABS (all labs ordered are listed, but only abnormal results are displayed)  Labs Reviewed - No data to display ____________________________________________  EKG   ____________________________________________  RADIOLOGY Unk Pinto, personally viewed and evaluated these images (plain radiographs) as part of my medical decision making, as well as reviewing the written report by the radiologist.    MR LUMBAR SPINE WO CONTRAST  Result Date: 01/31/2021 CLINICAL DATA:  Initial evaluation for acute low back pain status post injury. EXAM: MRI LUMBAR SPINE WITHOUT CONTRAST TECHNIQUE: Multiplanar, multisequence MR imaging of the lumbar spine was performed. No intravenous contrast was administered. COMPARISON:  None available. FINDINGS: Segmentation: Standard. Lowest well-formed disc space labeled the L5-S1 level. Alignment: Mild levoscoliosis. Alignment otherwise normal with preservation of the normal lumbar lordosis. No listhesis. Vertebrae: Vertebral body height maintained without acute or chronic fracture. Bone marrow signal intensity within normal limits. No discrete or worrisome osseous lesions. No abnormal marrow edema. Conus medullaris and cauda equina: Conus extends to the T12-L1 level. Conus and cauda equina appear normal. Paraspinal and other soft tissues: Paraspinous soft  tissues within normal limits. Visualized visceral structures are normal. Disc levels: No significant findings are seen through the L4-5 level. L5-S1: Tiny central disc protrusion with slight caudad angulation (series 8, image 32). No significant stenosis or evidence for neural impingement. Foramina remain patent. IMPRESSION: 1. Tiny central disc protrusion at L5-S1 without stenosis or neural impingement. 2. Underlying mild levoscoliosis. 3. Otherwise unremarkable and normal MRI of the lumbar spine. No other acute abnormality. Electronically Signed   By: Jeannine Boga M.D.   On: 01/31/2021 22:31    ____________________________________________    PROCEDURES  Procedure(s) performed:     Procedures     Medications  morphine 4 MG/ML injection 4 mg (4 mg Intravenous Given 01/31/21 2213)  ondansetron (ZOFRAN) injection 4 mg (4 mg Intravenous Given 01/31/21 2213)  methylPREDNISolone sodium succinate (SOLU-MEDROL) 125 mg/2 mL injection 125 mg (125 mg Intravenous Given 01/31/21 2246)     ____________________________________________   INITIAL IMPRESSION / ASSESSMENT AND PLAN / ED  COURSE  Pertinent labs & imaging results that were available during my care of the patient were reviewed by me and considered in my medical decision making (see chart for details).      Assessment and Plan:  Low back pain:  30 year old female presents to the emergency department with acute low back pain after patient attempted to pick up a keg.  Vital signs were reassuring at triage.  On physical exam, patient had midline lumbar spine tenderness and stated that she felt like she was unable to bear weight with tingling in her feet.  MRI of the lumbar spine was obtained which showed tiny central disc protrusion at L5-S1 but no other acute abnormality.  Patient was given IV Solu-Medrol prior to discharge and taper prednisone as well as a muscle relaxer.  Return precautions were given to return with new or  worsening symptoms.   ____________________________________________  FINAL CLINICAL IMPRESSION(S) / ED DIAGNOSES  Final diagnoses:  Acute bilateral low back pain, unspecified whether sciatica present      NEW MEDICATIONS STARTED DURING THIS VISIT:  ED Discharge Orders         Ordered    predniSONE (STERAPRED UNI-PAK 21 TAB) 10 MG (21) TBPK tablet        01/31/21 2240    methocarbamol (ROBAXIN) 500 MG tablet  Every 8 hours PRN        01/31/21 2240              This chart was dictated using voice recognition software/Dragon. Despite best efforts to proofread, errors can occur which can change the meaning. Any change was purely unintentional.     Karren Cobble 01/31/21 2255    Blake Divine, MD 02/05/21 [phone]

## 2021-01-31 NOTE — ED Triage Notes (Signed)
Pt brought in [facility] [employer]  Pt picked up a keg of beer while working and hurt her lower back  Iv in place zofran and fentanyl given by ems.  Pt alert.

## 2021-04-30 ENCOUNTER — Ambulatory Visit (INDEPENDENT_AMBULATORY_CARE_PROVIDER_SITE_OTHER): Payer: Medicaid Other | Admitting: Certified Nurse Midwife

## 2021-04-30 ENCOUNTER — Other Ambulatory Visit: Payer: Self-pay

## 2021-04-30 ENCOUNTER — Encounter: Payer: Self-pay | Admitting: Certified Nurse Midwife

## 2021-04-30 ENCOUNTER — Other Ambulatory Visit (HOSPITAL_COMMUNITY)
Admission: RE | Admit: 2021-04-30 | Discharge: 2021-04-30 | Disposition: A | Discharge status: Home / Self Care | Payer: Medicaid Other | Source: Ambulatory Visit | Attending: Certified Nurse Midwife | Admitting: Certified Nurse Midwife

## 2021-04-30 VITALS — BP 103/70 | HR 73 | Ht 64.0 in | Wt 132.9 lb

## 2021-04-30 DIAGNOSIS — Z124 Encounter for screening for malignant neoplasm of cervix: Secondary | ICD-10-CM

## 2021-04-30 DIAGNOSIS — Z01419 Encounter for gynecological examination (general) (routine) without abnormal findings: Secondary | ICD-10-CM

## 2021-04-30 NOTE — Progress Notes (Signed)
GYN ENCOUNTER NOTE  Subjective:       Melanie York is a 30 y.o. [phone] female is here for gynecologic evaluation of the following issues:  1. Repeat pap smear after completion of Leep procedure by Dr. Marcelline Mates 10/01/2020. 2. Perineal discomfort, pt state she noticed same stabbing like pain in her perineum after having episode of constipation. State she has an episiotomy with her last delivery and that is the location of the pain.      Obstetric History OB History  Gravida Para Term Preterm AB Living  '4 4 4     4  '$ SAB IAB Ectopic Multiple Live Births          4    # Outcome Date GA Lbr Len/2nd Weight Sex Delivery Anes PTL Lv  4 Term 09/20/17 [redacted]w[redacted]d 6 lb 1 oz (2.75 kg) F Vag-Spont  N LIV  3 Term 05/20/13 423w0d6 lb 1.6 oz (2.767 kg) F Vag-Spont   LIV  2 Term 09/21/09 3734w0d lb 2.2 oz (2.785 kg) M Vag-Spont   LIV  1 Term 11/24/07 40w29w0dlb 1.6 oz (3.221 kg) F Vag-Spont   LIV    History reviewed. No pertinent past medical history.  Past Surgical History:  Procedure Laterality Date   LEEP N/A 10/01/2020   Procedure: LOOP ELECTROSURGICAL EXCISION PROCEDURE (LEEP);  Surgeon: CherRubie Maid;  Location: [facility] ORS;  Service: Gynecology;  Laterality: N/A;   TONSILLECTOMY     TONSILLECTOMY      Current Outpatient Medications on File Prior to Visit  Medication Sig Dispense Refill   venlafaxine (EFFEXOR) 75 MG tablet Take 75 mg by mouth 2 (two) times daily.     acetaminophen (TYLENOL) 500 MG tablet Take 2 tablets (1,000 mg total) by mouth every 6 (six) hours as needed. (Patient not taking: Reported on 04/30/2021) 60 tablet 0   predniSONE (STERAPRED UNI-PAK 21 TAB) 10 MG (21) TBPK tablet Take 6 tablets the first day, take 5 tablets the second day, take 4 tablets the third day, take 3 tablets the fourth day, take 2 tablets the fifth day, take 1 tablet the sixth day. (Patient not taking: Reported on 04/30/2021) 21 tablet 0   No current facility-administered medications on file prior to  visit.    Allergies  Allergen Reactions   Naproxen Rash    Social History   Socioeconomic History   Marital status: Single    Spouse name: Not on file   Number of children: 4   Years of education: Not on file   Highest education level: Not on file  Occupational History   Not on file  Tobacco Use   Smoking status: Former    Packs/day: 0.50    Types: Cigarettes   Smokeless tobacco: Never  Vaping Use   Vaping Use: Never used  Substance and Sexual Activity   Alcohol use: Yes    Comment: occ. before pregnancy   Drug use: No   Sexual activity: Not Currently    Partners: Male    Birth control/protection: None  Other Topics Concern   Not on file  Social History Narrative   Not on file   Social Determinants of Health   Financial Resource Strain: Not on file  Food Insecurity: Not on file  Transportation Needs: Not on file  Physical Activity: Not on file  Stress: Not on file  Social Connections: Not on file  Intimate Partner Violence: Not on file    Family History  Problem Relation Age of Onset   Diabetes Mother    Cancer Mother        cervical   Seizures Brother    Cancer Maternal Aunt    Cancer Maternal Grandmother     The following portions of the patient's history were reviewed and updated as appropriate: allergies, current medications, past family history, past medical history, past social history, past surgical history and problem list.  Review of Systems Review of Systems - Negative except as mentioned in hpi Review of Systems - General ROS: negative for - chills, fatigue, fever, hot flashes, malaise or night sweats Hematological and Lymphatic ROS: negative for - bleeding problems or swollen lymph nodes Gastrointestinal ROS: negative for - abdominal pain, blood in stools, change in bowel habits and nausea/vomiting Musculoskeletal ROS: negative for - joint pain, muscle pain or muscular weakness Genito-Urinary ROS: negative for - change in menstrual cycle,  dysmenorrhea, dyspareunia, dysuria, genital discharge, genital ulcers, hematuria, incontinence, irregular/heavy menses, nocturia or pelvic pain  Objective:   BP 103/70   Pulse 73   Ht '5\' 4"'$  (1.626 m)   Wt 132 lb 14.4 oz (60.3 kg)   LMP 04/25/2021   BMI 22.81 kg/m  CONSTITUTIONAL: Well-developed, well-nourished female in no acute distress.  HENT:  Normocephalic, atraumatic.  NECK: Normal range of motion, supple, no masses.  Normal thyroid.  SKIN: Skin is warm and dry. No rash noted. Not diaphoretic. No erythema. No pallor. [place]el[place]: Alert and oriented to person, place, and time. PSYCHIATRIC: Normal mood and affect. Normal behavior. Normal judgment and thought content. CARDIOVASCULAR:Not Examined RESPIRATORY: Not Examined BREASTS: Not Examined ABDOMEN: Soft, non distended; Non tender.  No Organomegaly. PELVIC:  External Genitalia: Normal  BUS: Normal  Vagina: Normal  Cervix: Normal Perineum: normal tissue, no redness or irritation noted MUSCULOSKELETAL: Normal range of motion. No tenderness.  No cyanosis, clubbing, or edema.     Assessment:   Pap smear      Plan:   Pap smear collected. Will follow up with results. Discussed scar tissue from episiotomy likely cause of pain with the straining from the BM. Encouraged pt to use warm compress or bath and to complete perineal message to area . She verbalize and agrees to plan of care. Follow up prn.   Philip Aspen, CNM

## 2021-05-08 LAB — CYTOLOGY - PAP
Comment: NEGATIVE
Comment: NEGATIVE
Diagnosis: UNDETERMINED — AB
HPV 16: NEGATIVE
HPV 18 / 45: NEGATIVE
High risk HPV: POSITIVE — AB

## 2021-05-29 ENCOUNTER — Encounter: Payer: Medicaid Other | Admitting: Certified Nurse Midwife

## 2021-06-11 NOTE — Progress Notes (Deleted)
    GYNECOLOGY PROGRESS NOTE  Subjective:    Patient ID: Melanie York, female    DOB: 1991/02/08, 30 y.o.   MRN: [id number]  HPI  Patient is a 30 y.o. G9P4004 female who presents for Colposcopy.  {Common ambulatory SmartLinks:19316}  Review of Systems {ros; complete:30496}   Objective:   There were no vitals taken for this visit. There is no height or weight on file to calculate BMI. General appearance: {general exam:16600} Abdomen: {abdominal exam:16834} Pelvic: {pelvic exam:16852::"cervix normal in appearance","external genitalia normal","no adnexal masses or tenderness","no cervical motion tenderness","rectovaginal septum normal","uterus normal size, shape, and consistency","vagina normal without discharge"} Extremities: {extremity exam:5109} Neurologic: {neuro exam:17854}   Assessment:   No diagnosis found.   Plan:   There are no diagnoses linked to this encounter.    Rubie Maid, MD [facility].

## 2021-06-12 ENCOUNTER — Encounter: Payer: Medicaid Other | Admitting: Obstetrics and Gynecology

## 2021-07-15 NOTE — Patient Instructions (Signed)
COLPOSCOPY POST-PROCEDURE INSTRUCTIONS  You may take Ibuprofen, Aleve or Tylenol for cramping if needed.  If Monsel's solution was used, you will have a black discharge.  Light bleeding is normal.  If bleeding is heavier than your period, please call.  Put nothing in your vagina until the bleeding or discharge stops (usually 2 or 3 days).  We will call you within one week with biopsy results or discuss the results at your follow-up appointment if needed.  

## 2021-07-16 ENCOUNTER — Ambulatory Visit: Payer: Medicaid Other | Admitting: Obstetrics and Gynecology

## 2021-07-16 ENCOUNTER — Encounter: Payer: Self-pay | Admitting: Obstetrics and Gynecology

## 2021-07-16 ENCOUNTER — Other Ambulatory Visit: Payer: Self-pay

## 2021-07-16 NOTE — Progress Notes (Unsigned)
Pt present for colpo. Pt stated that she was doing well.

## 2021-08-12 NOTE — Progress Notes (Deleted)
    GYNECOLOGY PROGRESS NOTE  Subjective:    Patient ID: Melanie York, female    DOB: 16-Mar-1991, 30 y.o.   MRN: [id number]  HPI  Patient is a 30 y.o. G76P4004 female who presents for colposcopy.  {Common ambulatory SmartLinks:19316}  Review of Systems {ros; complete:30496}   Objective:   There were no vitals taken for this visit. There is no height or weight on file to calculate BMI. General appearance: {general exam:16600} Abdomen: {abdominal exam:16834} Pelvic: {pelvic exam:16852::"cervix normal in appearance","external genitalia normal","no adnexal masses or tenderness","no cervical motion tenderness","rectovaginal septum normal","uterus normal size, shape, and consistency","vagina normal without discharge"} Extremities: {extremity exam:5109} Neurologic: {neuro exam:17854}   Assessment:   No diagnosis found.   Plan:   There are no diagnoses linked to this encounter.

## 2021-08-13 ENCOUNTER — Encounter: Payer: Medicaid Other | Admitting: Obstetrics and Gynecology

## 2021-08-22 ENCOUNTER — Encounter: Payer: Self-pay | Admitting: Obstetrics and Gynecology

## 2021-08-28 ENCOUNTER — Encounter: Payer: Medicaid Other | Admitting: Obstetrics and Gynecology

## 2021-10-18 ENCOUNTER — Encounter: Payer: Self-pay | Admitting: Obstetrics and Gynecology

## 2021-10-18 ENCOUNTER — Other Ambulatory Visit (HOSPITAL_COMMUNITY)
Admission: RE | Admit: 2021-10-18 | Discharge: 2021-10-18 | Disposition: A | Discharge status: Home / Self Care | Payer: Medicaid Other | Source: Ambulatory Visit | Attending: Obstetrics and Gynecology | Admitting: Obstetrics and Gynecology

## 2021-10-18 ENCOUNTER — Other Ambulatory Visit: Payer: Self-pay

## 2021-10-18 ENCOUNTER — Ambulatory Visit (INDEPENDENT_AMBULATORY_CARE_PROVIDER_SITE_OTHER): Payer: Medicaid Other | Admitting: Obstetrics and Gynecology

## 2021-10-18 VITALS — BP 109/70 | HR 70 | Ht 64.0 in | Wt 135.0 lb

## 2021-10-18 DIAGNOSIS — R87618 Other abnormal cytological findings on specimens from cervix uteri: Secondary | ICD-10-CM | POA: Diagnosis present

## 2021-10-18 DIAGNOSIS — Z3200 Encounter for pregnancy test, result unknown: Secondary | ICD-10-CM | POA: Diagnosis present

## 2021-10-18 DIAGNOSIS — N871 Moderate cervical dysplasia: Secondary | ICD-10-CM | POA: Diagnosis not present

## 2021-10-18 LAB — POCT URINE PREGNANCY: Preg Test, Ur: NEGATIVE

## 2021-10-18 NOTE — Addendum Note (Signed)
Addended by: Chilton Greathouse on: 10/18/2021 12:13 PM   Modules accepted: Orders

## 2021-10-18 NOTE — Progress Notes (Signed)
° ° ° °  GYNECOLOGY OFFICE COLPOSCOPY PROCEDURE NOTE  31 y.o. 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 S/p LEEP in 10/01/2020 for cervical dysplasia. Discussed role for HPV in cervical dysplasia, need for surveillance.  Patient gave informed written consent, time out was performed.  Placed in lithotomy position. Cervix viewed with speculum and colposcope after application of acetic acid.   Colposcopy adequate? Yes  no visible lesions, no mosaicism, no punctation, and no abnormal vasculature; no biopsies obtained.  ECC specimen obtained. All specimens were labeled and sent to pathology.  Chaperone was present during entire procedure.  Patient was given post procedure instructions.  Will follow up pathology and manage accordingly; patient will be contacted with results and recommendations.  Routine preventative health maintenance measures emphasized.    Rubie Maid, MD [facility]

## 2021-10-18 NOTE — Patient Instructions (Signed)
Colposcopy, Care After ?The following information offers guidance on how to care for yourself after your procedure. Your health care provider may also give you more specific instructions. If you have problems or questions, contact your health care provider. ?What can I expect after the procedure? ?If you had a colposcopy without a biopsy, you can expect to feel fine right away after your procedure. However, you may have some spotting of blood for a few days. You can return to your normal activities. ?If you had a colposcopy with a biopsy, it is common after the procedure to have: ?Soreness and mild pain. These may last for a few days. ?Mild vaginal bleeding or discharge that is dark-colored and grainy. This may last for a few days. The discharge may be caused by a liquid (solution) that was used during the procedure. You may need to wear a sanitary pad during this time. ?Spotting of blood for at least 48 hours after the procedure. ?Follow these instructions at home: ?Medicines ?Take over-the-counter and prescription medicines only as told by your health care provider. ?Talk with your health care provider about what type of over-the-counter pain medicines and prescription medicines you can start to take again. It is especially important to talk with your health care provider if you take blood thinners. ?Activity ?Avoid using douche products, using tampons, and having sex for at least 3 days after the procedure or for as long as told by your health care provider. ?Return to your normal activities as told by your health care provider. Ask your health care provider what activities are safe for you. ?General instructions ?Ask your health care provider if you may take baths, swim, or use a hot tub. You may take showers. ?If you use birth control (contraception), continue to use it. ?Keep all follow-up visits. This is important. ?Contact a health care provider if: ?You have a fever or chills. ?You faint or feel  light-headed. ?Get help right away if: ?You have heavy bleeding from your vagina or pass blood clots. Heavy bleeding is bleeding that soaks through a sanitary pad in less than 1 hour. ?You have vaginal discharge that is abnormal, is yellow in color, or smells bad. This could be a sign of infection. ?You have severe pain or cramps in your lower abdomen that do not go away with medicine. ?Summary ?If you had a colposcopy without a biopsy, you can expect to feel fine right away, but you may have some spotting of blood for a few days. You can return to your normal activities. ?If you had a colposcopy with a biopsy, it is common to have mild pain for a few days and spotting for 48 hours after the procedure. ?Avoid using douche products, using tampons, and having sex for at least 3 days after the procedure or for as long as told by your health care provider. ?Get help right away if you have heavy bleeding, severe pain, or signs of infection. ?This information is not intended to replace advice given to you by your health care provider. Make sure you discuss any questions you have with your health care provider. ?Document Revised: 02/03/2021 Document Reviewed: 02/03/2021 ?Elsevier Patient Education ? 2022 Elsevier Inc[employer] ? ?

## 2021-10-21 LAB — SURGICAL PATHOLOGY

## 2022-03-12 ENCOUNTER — Ambulatory Visit (INDEPENDENT_AMBULATORY_CARE_PROVIDER_SITE_OTHER): Payer: Medicaid Other | Admitting: Dermatology

## 2022-03-12 DIAGNOSIS — D225 Melanocytic nevi of trunk: Secondary | ICD-10-CM

## 2022-03-12 DIAGNOSIS — D485 Neoplasm of uncertain behavior of skin: Secondary | ICD-10-CM

## 2022-03-12 DIAGNOSIS — D229 Melanocytic nevi, unspecified: Secondary | ICD-10-CM

## 2022-03-12 DIAGNOSIS — L814 Other melanin hyperpigmentation: Secondary | ICD-10-CM

## 2022-03-12 NOTE — Patient Instructions (Signed)
Wound Care Instructions  Cleanse wound gently with soap and water once a day then pat dry with clean gauze. Apply a thing coat of Petrolatum (petroleum jelly, "Vaseline") over the wound (unless you have an allergy to this). We recommend that you use a new, sterile tube of Vaseline. Do not pick or remove scabs. Do not remove the yellow or white "healing tissue" from the base of the wound.  Cover the wound with fresh, clean, nonstick gauze and secure with paper tape. You may use Band-Aids in place of gauze and tape if the would is small enough, but would recommend trimming much of the tape off as there is often too much. Sometimes Band-Aids can irritate the skin.  You should call the office for your biopsy report after 1 week if you have not already been contacted.  If you experience any problems, such as abnormal amounts of bleeding, swelling, significant bruising, significant pain, or evidence of infection, please call the office immediately.  FOR ADULT SURGERY PATIENTS: If you need something for pain relief you may take 1 extra strength Tylenol (acetaminophen) AND 2 Ibuprofen (200mg each) together every 4 hours as needed for pain. (do not take these if you are allergic to them or if you have a reason you should not take them.) Typically, you may only need pain medication for 1 to 3 days.    Due to recent changes in healthcare laws, you may see results of your pathology and/or laboratory studies on MyChart before the doctors have had a chance to review them. We understand that in some cases there may be results that are confusing or concerning to you. Please understand that not all results are received at the same time and often the doctors may need to interpret multiple results in order to provide you with the best plan of care or course of treatment. Therefore, we ask that you please give us 2 business days to thoroughly review all your results before contacting the office for clarification. Should we  see a critical lab result, you will be contacted sooner.   If You Need Anything After Your Visit  If you have any questions or concerns for your doctor, please call our main line at [phone] and press option 4 to reach your doctor's medical assistant. If no one answers, please leave a voicemail as directed and we will return your call as soon as possible. Messages left after 4 pm will be answered the following business day.   You may also send us a message via MyChart. We typically respond to MyChart messages within 1-2 business days.  For prescription refills, please ask your pharmacy to contact our office. Our fax number is [phone].  If you have an urgent issue when the clinic is closed that cannot wait until the next business day, you can page your doctor at the number below.    Please note that while we do our best to be available for urgent issues outside of office hours, we are not available 24/7.   If you have an urgent issue and are unable to reach us, you may choose to seek medical care at your doctor's office, retail clinic, urgent care center, or emergency room.  If you have a medical emergency, please immediately call 911 or go to the emergency department.  Pager Numbers  - Dr. Kowalski: [phone]  - Dr. Moye: [phone]  - Dr. Stewart: [phone]  In the event of inclement weather, please call our main line at [phone]   for an update on the status of any delays or closures.  Dermatology Medication Tips: Please keep the boxes that topical medications come in in order to help keep track of the instructions about where and how to use these. Pharmacies typically print the medication instructions only on the boxes and not directly on the medication tubes.   If your medication is too expensive, please contact our office at [phone] option 4 or send us a message through MyChart.   We are unable to tell what your co-pay for medications will be in advance  as this is different depending on your insurance coverage. However, we may be able to find a substitute medication at lower cost or fill out paperwork to get insurance to cover a needed medication.   If a prior authorization is required to get your medication covered by your insurance company, please allow us 1-2 business days to complete this process.  Drug prices often vary depending on where the prescription is filled and some pharmacies may offer cheaper prices.  The website www.goodrx.com contains coupons for medications through different pharmacies. The prices here do not account for what the cost may be with help from insurance (it may be cheaper with your insurance), but the website can give you the price if you did not use any insurance.  - You can print the associated coupon and take it with your prescription to the pharmacy.  - You may also stop by our office during regular business hours and pick up a GoodRx coupon card.  - If you need your prescription sent electronically to a different pharmacy, notify our office through [place] MyChart or by phone at [phone] option 4.     Si Usted Necesita Algo Despus de Su Visita  Tambin puede enviarnos un mensaje a travs de MyChart. Por lo general respondemos a los mensajes de MyChart en el transcurso de 1 a 2 das hbiles.  Para renovar recetas, por favor pida a su farmacia que se ponga en contacto con nuestra oficina. Nuestro nmero de fax es el [phone].  Si tiene un asunto urgente cuando la clnica est cerrada y que no puede esperar hasta el siguiente da hbil, puede llamar/localizar a su doctor(a) al nmero que aparece a continuacin.   Por favor, tenga en cuenta que aunque hacemos todo lo posible para estar disponibles para asuntos urgentes fuera del horario de oficina, no estamos disponibles las 24 horas del da, los 7 das de la semana.   Si tiene un problema urgente y no puede comunicarse con nosotros, puede optar  por buscar atencin mdica  en el consultorio de su doctor(a), en una clnica privada, en un centro de atencin urgente o en una sala de emergencias.  Si tiene una emergencia mdica, por favor llame inmediatamente al 911 o vaya a la sala de emergencias.  Nmeros de bper  - Dr. Kowalski: [phone]  - Dra. Moye: [phone]  - Dra. Stewart: [phone]  En caso de inclemencias del tiempo, por favor llame a nuestra lnea principal al [phone] para una actualizacin sobre el estado de cualquier retraso o cierre.  Consejos para la medicacin en dermatologa: Por favor, guarde las cajas en las que vienen los medicamentos de uso tpico para ayudarle a seguir las instrucciones sobre dnde y cmo usarlos. Las farmacias generalmente imprimen las instrucciones del medicamento slo en las cajas y no directamente en los tubos del medicamento.   Si su medicamento es muy caro, por favor, pngase en contacto con nuestra   oficina llamando al [phone] y presione la opcin 4 o envenos un mensaje a travs de MyChart.   No podemos decirle cul ser su copago por los medicamentos por adelantado ya que esto es diferente dependiendo de la cobertura de su seguro. Sin embargo, es posible que podamos encontrar un medicamento sustituto a menor costo o llenar un formulario para que el seguro cubra el medicamento que se considera necesario.   Si se requiere una autorizacin previa para que su compaa de seguros cubra su medicamento, por favor permtanos de 1 a 2 das hbiles para completar este proceso.  Los precios de los medicamentos varan con frecuencia dependiendo del lugar de dnde se surte la receta y alguna farmacias pueden ofrecer precios ms baratos.  El sitio web www.goodrx.com tiene cupones para medicamentos de diferentes farmacias. Los precios aqu no tienen en cuenta lo que podra costar con la ayuda del seguro (puede ser ms barato con su seguro), pero el sitio web puede darle el precio si  no utiliz ningn seguro.  - Puede imprimir el cupn correspondiente y llevarlo con su receta a la farmacia.  - Tambin puede pasar por nuestra oficina durante el horario de atencin regular y recoger una tarjeta de cupones de GoodRx.  - Si necesita que su receta se enve electrnicamente a una farmacia diferente, informe a nuestra oficina a travs de MyChart de  o por telfono llamando al [phone] y presione la opcin 4.  

## 2022-03-12 NOTE — Progress Notes (Signed)
   New Patient Visit  Subjective  Melanie York is a 31 y.o. female who presents for the following: Skin Problem (Patient here today for a spot at left low abdomen. Patient advises the spot has been there for at least 7 years but has gotten larger and darker. Patient with fhx skin cancer. ).   The following portions of the chart were reviewed this encounter and updated as appropriate:       Review of Systems:  No other skin or systemic complaints except as noted in HPI or Assessment and Plan.  Objective  Well appearing patient in no apparent distress; mood and affect are within normal limits.  A focused examination was performed including abdomen. Relevant physical exam findings are noted in the Assessment and Plan.  left mid abdomen 0.5 cm dark gray brown papule  Blue nevus vs combined nevus r/o Atypia         Assessment & Plan  Neoplasm of uncertain behavior of skin left mid abdomen  Epidermal / dermal shaving  Lesion diameter (cm):  0.6 Informed consent: discussed and consent obtained   Patient was prepped and draped in usual sterile fashion: Area prepped with alcohol. Anesthesia: the lesion was anesthetized in a standard fashion   Anesthetic:  1% lidocaine w/ epinephrine 1-100,000 buffered w/ 8.4% NaHCO3 Instrument used: flexible razor blade   Hemostasis achieved with: pressure, aluminum chloride and electrodesiccation   Outcome: patient tolerated procedure well   Post-procedure details: wound care instructions given   Post-procedure details comment:  Ointment and small bandage applied.   Specimen 1 - Surgical pathology Differential Diagnosis: Blue nevus vs combined nevus r/o Atypia  Check Margins: No 0.5 cm dark gray brown papule    Discussed resulting small scar with shave removal, and possible recurrence of lesion.  Recommend vaseline ointment to area daily and cover until healed.  Recommend photoprotection/sunscreen to area to prevent discoloration of  scar.  Once healed, may apply OTC Serica scar gel bid to thickened scars.   Melanocytic Nevi - Tan-brown and/or pink-flesh-colored symmetric macules and papules - Benign appearing on exam today - Observation - Call clinic for new or changing moles - Recommend daily use of broad spectrum spf 30+ sunscreen to sun-exposed areas.   Lentigines - Scattered tan macules - Due to sun exposure - Benign-appering, observe - Recommend daily broad spectrum sunscreen SPF 30+ to sun-exposed areas, reapply every 2 hours as needed. - Call for any changes   Return pending biopsy result.  Graciella Belton, RMA, am acting as scribe for Brendolyn Patty, MD .  Documentation: I have reviewed the above documentation for accuracy and completeness, and I agree with the above.  Brendolyn Patty MD

## 2022-03-17 ENCOUNTER — Telehealth: Payer: Self-pay

## 2022-03-17 NOTE — Telephone Encounter (Signed)
Discussed biopsy results with pt  °

## 2022-05-05 ENCOUNTER — Encounter: Payer: Medicaid Other | Admitting: Certified Nurse Midwife

## 2023-02-11 IMAGING — MR MR LUMBAR SPINE W/O CM
5 series · 31 of 48 positions shown · non-contrast
Comparison: None available.

CLINICAL DATA: Initial evaluation for acute low back pain status
post injury.

EXAM:
MRI LUMBAR SPINE WITHOUT CONTRAST
TECHNIQUE: Multiplanar, multisequence MR imaging of the lumbar spine was
performed. No intravenous contrast was administered.

[Series 5: T2 · sagittal · 4.0mm · 0.81mm/px · 6 of 17 slices shown (1 of 2)]
[im 1/17]
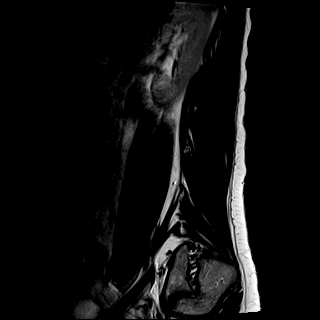
[im 4/17]
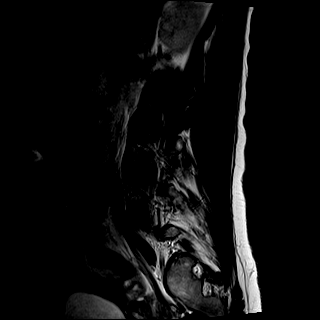
[im 7/17]
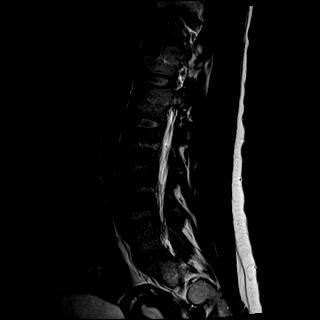
[im 10/17]
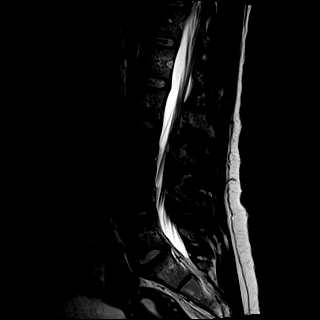
[im 13/17]
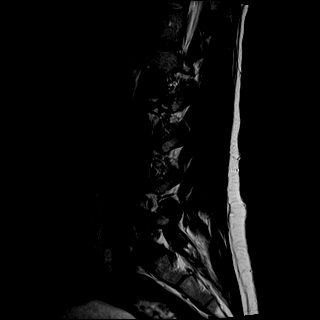
[im 17/17]
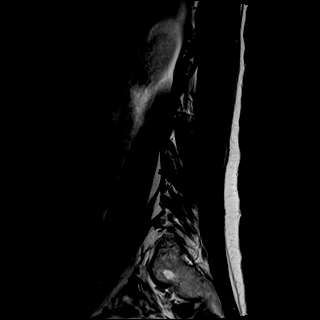

[Series 6: T1 · sagittal · 4.0mm · 0.81mm/px · 7 of 17 slices shown (1 of 2)]
[im 1/17]
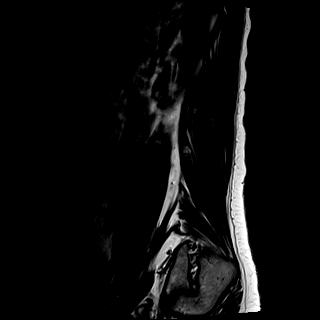
[im 3/17]
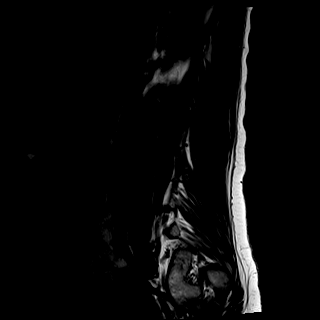
[im 6/17]
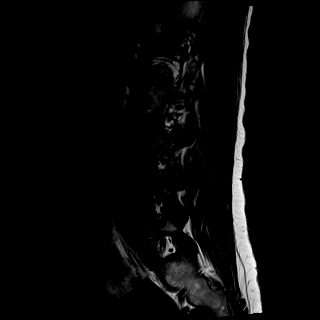
[im 9/17]
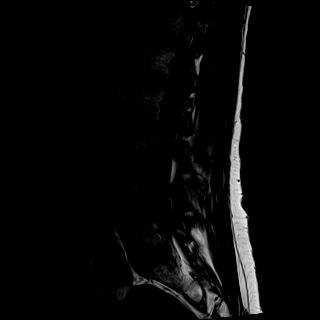
[im 11/17]
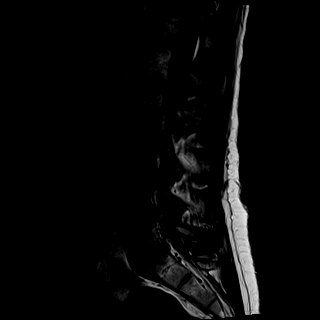
[im 14/17]
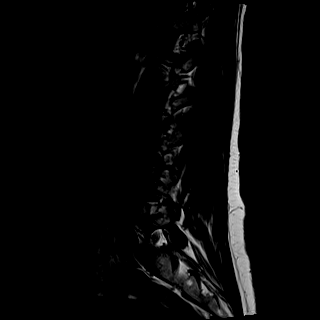
[im 17/17]
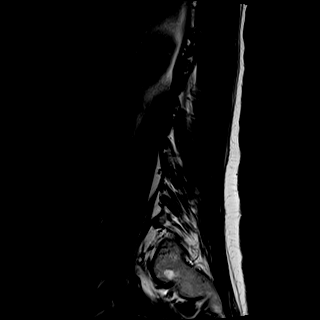

[Series 7: STIR · sagittal · 4.0mm · 0.41mm/px · 2 of 17 slices shown]
[im 1/17]
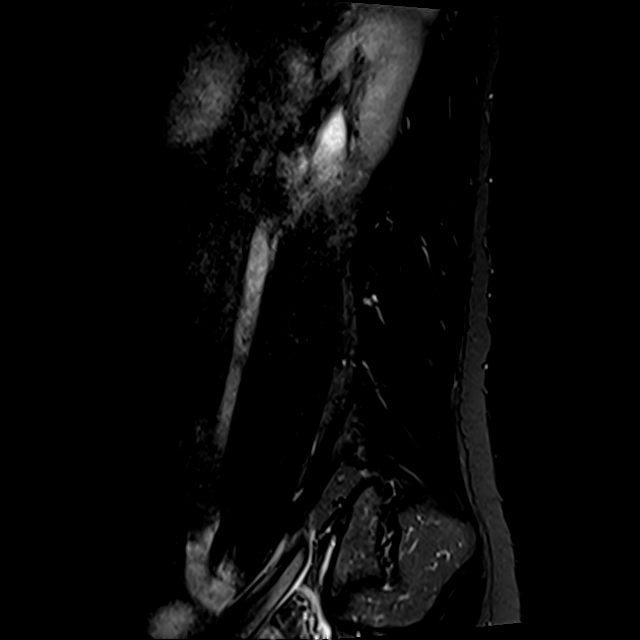
[im 3/17]
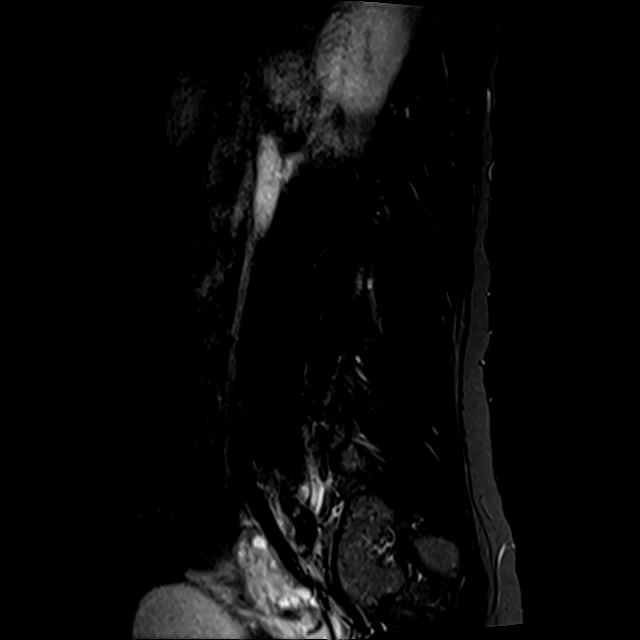

[Series 8: T2 · axial · 4.0mm · 0.78mm/px · z∈[-52,+147]mm · 8 of 36 slices shown (2 of 2)]
[im 1/36]
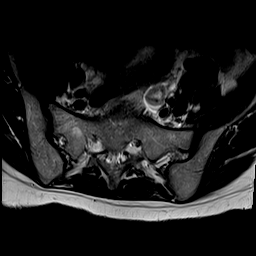
[im 6/36]
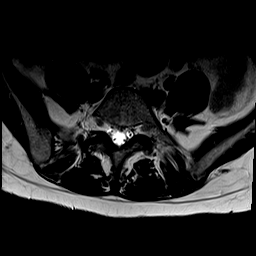
[im 11/36]
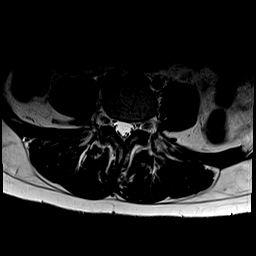
[im 17/36]
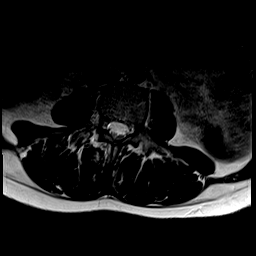
[im 19/36]
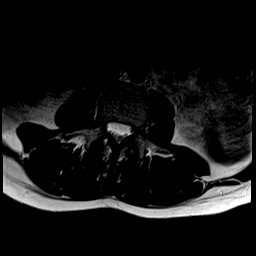
[im 25/36]
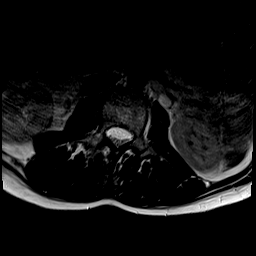
[im 30/36]
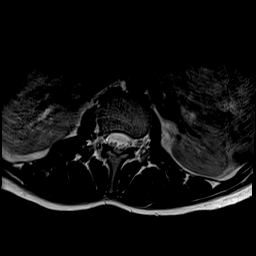
[im 36/36]
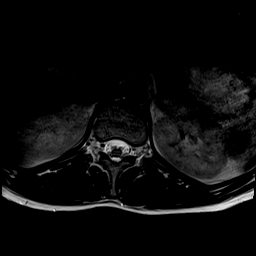

[Series 9: T1 · axial · 4.0mm · 0.39mm/px · z∈[-52,+147]mm · 8 of 36 slices shown (2 of 2)]
[im 1/36]
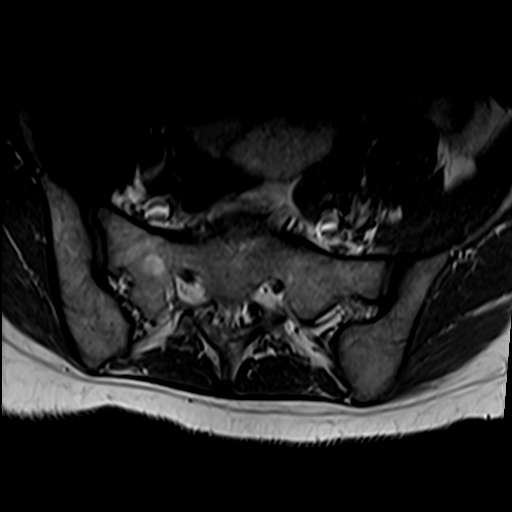
[im 6/36]
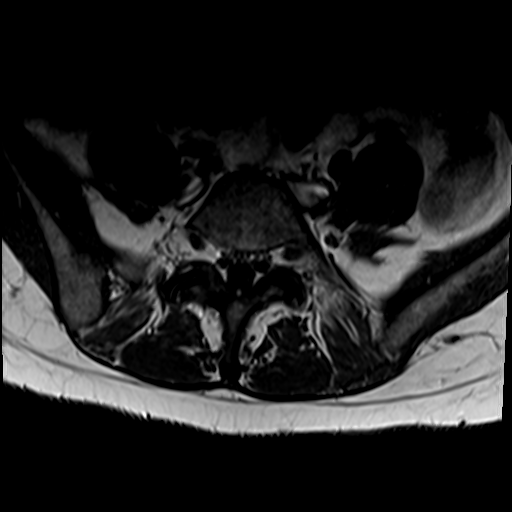
[im 11/36]
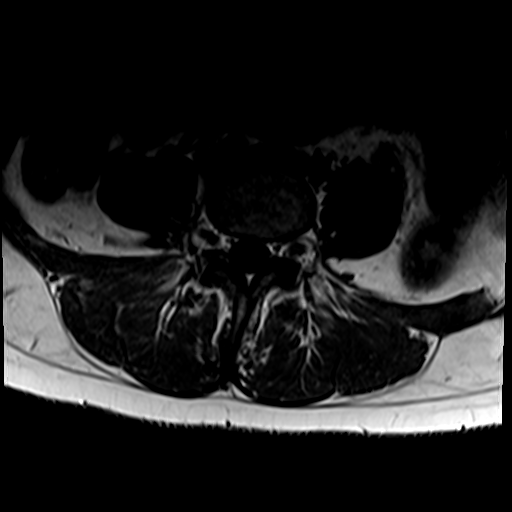
[im 17/36]
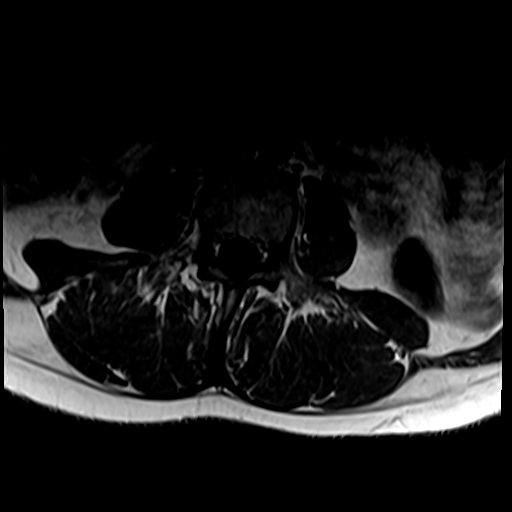
[im 19/36]
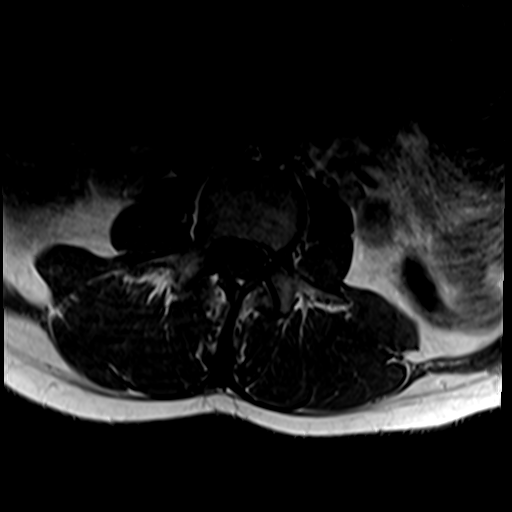
[im 25/36]
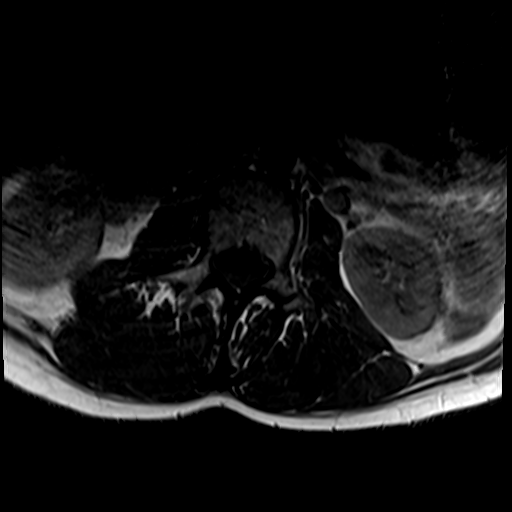
[im 30/36]
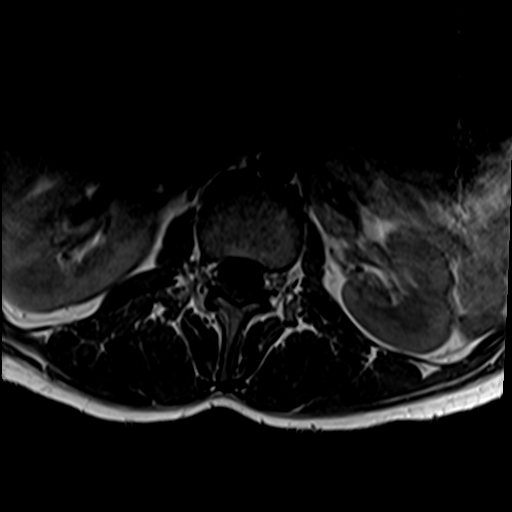
[im 36/36]
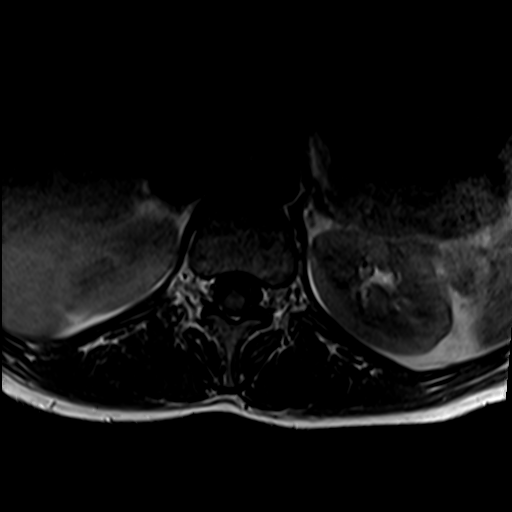

[31 of 48 positions shown; findings below may reference images not displayed]

FINDINGS: Segmentation: Standard. Lowest well-formed disc space labeled the
L5-S1 level.

Alignment: Mild levoscoliosis. Alignment otherwise normal with
preservation of the normal lumbar lordosis. No listhesis.

Vertebrae: Vertebral body height maintained without acute or chronic
fracture. Bone marrow signal intensity within normal limits. No
discrete or worrisome osseous lesions. No abnormal marrow edema.

Conus medullaris and cauda equina: Conus extends to the T12-L1
level. Conus and cauda equina appear normal.

Paraspinal and other soft tissues: Paraspinous soft tissues within
normal limits. Visualized visceral structures are normal.

Disc levels:

No significant findings are seen through the L4-5 level.

L5-S1: Tiny central disc protrusion with slight caudad angulation
(series 8, image 32). No significant stenosis or evidence for neural
impingement. Foramina remain patent.
IMPRESSION: 1. Tiny central disc protrusion at L5-S1 without stenosis or neural
impingement.
2. Underlying mild levoscoliosis.
3. Otherwise unremarkable and normal MRI of the lumbar spine. No
other acute abnormality.

## 2023-06-14 ENCOUNTER — Other Ambulatory Visit: Payer: Self-pay

## 2023-06-14 ENCOUNTER — Emergency Department
Admission: EM | Admit: 2023-06-14 | Discharge: 2023-06-14 | Disposition: A | Discharge status: Home / Self Care | Payer: Medicaid Other | Attending: Emergency Medicine | Admitting: Emergency Medicine

## 2023-06-14 DIAGNOSIS — K047 Periapical abscess without sinus: Secondary | ICD-10-CM | POA: Diagnosis not present

## 2023-06-14 DIAGNOSIS — K0889 Other specified disorders of teeth and supporting structures: Secondary | ICD-10-CM | POA: Diagnosis present

## 2023-06-14 MED ORDER — AMOXICILLIN 500 MG PO CAPS: 500.0000 mg | ORAL_CAPSULE | Freq: Three times a day (TID) | ORAL | 0 refills | Status: DC

## 2023-06-14 MED ORDER — HYDROCODONE-ACETAMINOPHEN 5-325 MG PO TABS: 1.0000 | ORAL_TABLET | Freq: Four times a day (QID) | ORAL | 0 refills | Status: DC | PRN

## 2023-06-14 MED ORDER — LIDOCAINE VISCOUS HCL 2 % MT SOLN
15.0000 mL | Freq: Once | OROMUCOSAL | Status: AC
  Administered 2023-06-14: 15 mL via OROMUCOSAL
  Filled 2023-06-14: qty 15

## 2023-06-14 MED ORDER — HYDROCODONE-ACETAMINOPHEN 5-325 MG PO TABS
1.0000 | ORAL_TABLET | Freq: Once | ORAL | Status: AC
  Administered 2023-06-14: 1 via ORAL
  Filled 2023-06-14: qty 1

## 2023-06-14 MED ORDER — AMOXICILLIN 500 MG PO CAPS
500.0000 mg | ORAL_CAPSULE | Freq: Once | ORAL | Status: AC
  Administered 2023-06-14: 500 mg via ORAL
  Filled 2023-06-14: qty 1

## 2023-06-14 NOTE — ED Triage Notes (Signed)
Pt reports left side dental pain and reports needing to have tooth extracted. Pt reports the earliest appt for her dentist is December and she reports pain has increased and spread. Denies taking any abx currently.

## 2023-06-14 NOTE — Discharge Instructions (Signed)
1.  Take and finish antibiotic as prescribed. 2.  You may continue ibuprofen as needed for pain, Norco as needed for more severe pain. 3.  Return to the ER for worsening symptoms, persistent vomiting, swelling or other concerns.

## 2023-06-14 NOTE — ED Provider Notes (Signed)
[facility] Provider Note    Event Date/Time   First MD Initiated Contact with Patient 06/14/23 0205     (approximate)   History   Dental Pain   HPI  Melanie York is a 32 y.o. female who presents to the ED from home with a chief complaint of dental pain.  Patient reports left lower dental pain which began last week, saw her dentist and was told she would need extraction and referred to oral surgeon because she needs sedation.  Reports increased pain; "taking ibuprofen like crazy".     Past Medical History  No past medical history on file.   Active Problem List   Patient Active Problem List   Diagnosis Date Noted   CIN II (cervical intraepithelial neoplasia II) 08/28/2020   Dyspareunia, female 05/07/2020   Constipation    History of marijuana use 03/09/2017     Past Surgical History   Past Surgical History:  Procedure Laterality Date   LEEP N/A 10/01/2020   Procedure: LOOP ELECTROSURGICAL EXCISION PROCEDURE (LEEP);  Surgeon: Hildred Laser, MD;  Location: [facility] ORS;  Service: Gynecology;  Laterality: N/A;   TONSILLECTOMY     TONSILLECTOMY       Home Medications   Prior to Admission medications   Medication Sig Start Date End Date Taking? Authorizing Provider  amoxicillin (AMOXIL) 500 MG capsule Take 1 capsule (500 mg total) by mouth 3 (three) times daily. 06/14/23  Yes Irean Hong, MD  HYDROcodone-acetaminophen (NORCO) 5-325 MG tablet Take 1 tablet by mouth every 6 (six) hours as needed for moderate pain. 06/14/23  Yes Irean Hong, MD  venlafaxine (EFFEXOR) 75 MG tablet Take 75 mg by mouth 2 (two) times daily.    [provider]     Allergies  Naproxen   Family History   Family History  Problem Relation Age of Onset   Diabetes Mother    Cancer Mother        cervical   Seizures Brother    Cancer Maternal Aunt    Cancer Maternal Grandmother      Physical Exam  Triage Vital Signs: ED Triage Vitals  Encounter  Vitals Group     BP 06/14/23 0102 125/82     Systolic BP Percentile --      Diastolic BP Percentile --      Pulse Rate 06/14/23 0102 60     Resp 06/14/23 0102 16     Temp 06/14/23 0102 98.3 F (36.8 C)     Temp Source 06/14/23 0102 Oral     SpO2 06/14/23 0102 100 %     Weight 06/14/23 0101 130 lb (59 kg)     Height 06/14/23 0101 5\' 4"  (1.626 m)     Head Circumference --      Peak Flow --      Pain Score 06/14/23 0101 10     Pain Loc --      Pain Education --      Exclude from Growth Chart --     Updated Vital Signs: BP 125/82   Pulse 60   Temp 98.3 F (36.8 C) (Oral)   Resp 16   Ht 5\' 4"  (1.626 m)   Wt 59 kg   LMP 06/09/2023 (Exact Date)   SpO2 100%   BMI 22.31 kg/m    General: Awake, mild distress.  CV:  Good peripheral perfusion.  Resp:  Normal effort.  Abd:  No distention.  Other:  Left lower  molar tender to palpation with tongue blade.  No facial swelling.   ED Results / Procedures / Treatments  Labs (all labs ordered are listed, but only abnormal results are displayed) Labs Reviewed - No data to display   EKG  None   RADIOLOGY None   Official radiology report(s): No results found.   PROCEDURES:  Critical Care performed: No  Procedures   MEDICATIONS ORDERED IN ED: Medications  lidocaine (XYLOCAINE) 2 % viscous mouth solution 15 mL (has no administration in time range)  HYDROcodone-acetaminophen (NORCO/VICODIN) 5-325 MG per tablet 1 tablet (has no administration in time range)  amoxicillin (AMOXIL) capsule 500 mg (has no administration in time range)     IMPRESSION / MDM / ASSESSMENT AND PLAN / ED COURSE  I reviewed the triage vital signs and the nursing notes.                             32 year old female presenting with dentalgia.  Will start amoxicillin, lidocaine rinse, Norco and patient will follow-up with oral surgeon as planned.  Strict return precautions given.  Patient verbalizes understanding and agrees with plan of  care.  Patient's presentation is most consistent with acute, uncomplicated illness.   FINAL CLINICAL IMPRESSION(S) / ED DIAGNOSES   Final diagnoses:  Dentalgia  Dental infection     Rx / DC Orders   ED Discharge Orders          Ordered    amoxicillin (AMOXIL) 500 MG capsule  3 times daily        06/14/23 0214    HYDROcodone-acetaminophen (NORCO) 5-325 MG tablet  Every 6 hours PRN        06/14/23 0214             Note:  This document was prepared using Dragon voice recognition software and may include unintentional dictation errors.   Irean Hong, MD 06/14/23 [phone]

## 2024-05-10 ENCOUNTER — Emergency Department

## 2024-05-10 ENCOUNTER — Other Ambulatory Visit: Payer: Self-pay

## 2024-05-10 ENCOUNTER — Emergency Department
Admission: EM | Admit: 2024-05-10 | Discharge: 2024-05-10 | Disposition: A | Discharge status: Home / Self Care | Attending: Emergency Medicine | Admitting: Emergency Medicine

## 2024-05-10 DIAGNOSIS — Z3A01 Less than 8 weeks gestation of pregnancy: Secondary | ICD-10-CM | POA: Diagnosis not present

## 2024-05-10 DIAGNOSIS — O26891 Other specified pregnancy related conditions, first trimester: Secondary | ICD-10-CM | POA: Diagnosis present

## 2024-05-10 DIAGNOSIS — R109 Unspecified abdominal pain: Secondary | ICD-10-CM | POA: Diagnosis not present

## 2024-05-10 LAB — LIPASE, BLOOD: Lipase: 39 U/L (ref 11–51)

## 2024-05-10 LAB — COMPREHENSIVE METABOLIC PANEL WITH GFR
ALT: 14 U/L (ref 0–44)
AST: 19 U/L (ref 15–41)
Albumin: 4.3 g/dL (ref 3.5–5.0)
Alkaline Phosphatase: 50 U/L (ref 38–126)
Anion gap: 11 (ref 5–15)
BUN: 9 mg/dL (ref 6–20)
CO2: 22 mmol/L (ref 22–32)
Calcium: 9.3 mg/dL (ref 8.9–10.3)
Chloride: 104 mmol/L (ref 98–111)
Creatinine, Ser: 0.6 mg/dL (ref 0.44–1.00)
GFR, Estimated: >60 mL/min (ref >60)
Glucose, Bld: 97 mg/dL (ref 70–99)
Potassium: 3.6 mmol/L (ref 3.5–5.1)
Sodium: 137 mmol/L (ref 135–145)
Total Bilirubin: 0.7 mg/dL (ref 0.0–1.2)
Total Protein: 7.3 g/dL (ref 6.5–8.1)

## 2024-05-10 LAB — HCG, QUANTITATIVE, PREGNANCY: hCG, Beta Chain, Quant, S: 236353 m[IU]/mL — ABNORMAL HIGH (ref <5)

## 2024-05-10 LAB — URINALYSIS, ROUTINE W REFLEX MICROSCOPIC
Bilirubin Urine: NEGATIVE
Glucose, UA: NEGATIVE mg/dL
Hgb urine dipstick: NEGATIVE
Ketones, ur: 20 mg/dL — AB
Leukocytes,Ua: NEGATIVE
Nitrite: NEGATIVE
Protein, ur: NEGATIVE mg/dL
Specific Gravity, Urine: 1.011 (ref 1.005–1.030)
pH: 6 (ref 5.0–8.0)

## 2024-05-10 LAB — CBC
HCT: 38 % (ref 36.0–46.0)
Hemoglobin: 13.2 g/dL (ref 12.0–15.0)
MCH: 32.8 pg (ref 26.0–34.0)
MCHC: 34.7 g/dL (ref 30.0–36.0)
MCV: 94.5 fL (ref 80.0–100.0)
Platelets: 236 K/uL (ref 150–400)
RBC: 4.02 MIL/uL (ref 3.87–5.11)
RDW: 11.9 % (ref 11.5–15.5)
WBC: 10 K/uL (ref 4.0–10.5)
nRBC: 0 % (ref 0.0–0.2)

## 2024-05-10 LAB — POC URINE PREG, ED: Preg Test, Ur: POSITIVE — AB

## 2024-05-10 NOTE — Discharge Instructions (Signed)
 Start prenatal vitamins.  Follow up with gynecology.

## 2024-05-10 NOTE — ED Provider Notes (Signed)
 [facility] Provider Note    Event Date/Time   First MD Initiated Contact with Patient 05/10/24 1959     (approximate)   History   Abdominal Pain (X 2 days)   HPI  Melanie York is a 33 y.o. female with no significant past medical history and as listed in EMR presents to the emergency department for treatment and evaluation of abdominal pain for the past 2 to 3 days that feels similar to ovarian cysts that she has had in the past.  She denies vaginal bleeding or discharge.  No nausea, vomiting, diarrhea.     Physical Exam    Vitals:   05/10/24 1846  BP: 110/70  Pulse: 80  Resp: 18  Temp: 98 F (36.7 C)  SpO2: 99%    General: Awake, no distress.  CV:  Good peripheral perfusion.  Resp:  Normal effort.  Abd:  No distention.  Left side pelvic pain radiates into suprapubic area.  Abdomen is soft Other:     ED Results / Procedures / Treatments   Labs (all labs ordered are listed, but only abnormal results are displayed)  Labs Reviewed  URINALYSIS, ROUTINE W REFLEX MICROSCOPIC - Abnormal; Notable for the following components:      Result Value   Color, Urine YELLOW (*)    APPearance HAZY (*)    Ketones, ur 20 (*)    All other components within normal limits  HCG, QUANTITATIVE, PREGNANCY - Abnormal; Notable for the following components:   hCG, Beta Chain, [name] [phone] (*)    All other components within normal limits  POC URINE PREG, ED - Abnormal; Notable for the following components:   Preg Test, Ur POSITIVE (*)    All other components within normal limits  LIPASE, BLOOD  COMPREHENSIVE METABOLIC PANEL WITH GFR  CBC     EKG  Not indicated   RADIOLOGY  Image and radiology report reviewed and interpreted by me. Radiology report consistent with the same.  Ultrasound shows a single IUP measuring 7 weeks, 4 days with a EDC of 12/23/2024.   PROCEDURES:  Critical Care performed: No  Procedures   MEDICATIONS ORDERED IN  ED:  Medications - No data to display   IMPRESSION / MDM / ASSESSMENT AND PLAN / ED COURSE   I have reviewed the triage note and vital signs. Vital signs are stable.   Differential diagnosis includes, but is not limited to, ovarian cyst, ovarian torsion, pregnancy, acute cystitis  Patient's presentation is most consistent with acute complicated illness / injury requiring diagnostic workup.  33 year old female presenting to the emergency department for treatment and evaluation of left-sided abdominal pain that radiates into the suprapubic area that has been ongoing for the past 2 to 3 days.  See HPI for further details.  Urine pregnancy test is positive.  Beta hCG is 236,353.  Labs are otherwise normal.  Urinalysis is unremarkable.  Ultrasound shows a single IUP with fetal heart rate of 147.  Results discussed with the patient.  This is her fifth pregnancy with 4 children at home.  She was encouraged to start prenatal vitamins and call and schedule follow-up appoint with gynecology.  She is excited about this pregnancy and will follow-up as advised.      FINAL CLINICAL IMPRESSION(S) / ED DIAGNOSES   Final diagnoses:  Less than [redacted] weeks gestation of pregnancy     Rx / DC Orders   ED Discharge Orders     None  Note:  This document was prepared using Dragon voice recognition software and may include unintentional dictation errors.   Herlinda Kirk NOVAK, FNP 05/10/24 2252    Dorothyann Drivers, MD 05/10/24 2312

## 2024-05-10 NOTE — ED Triage Notes (Addendum)
 Pt to ed from home via POV for abd pain that she states feels similar to previous ovarian cysts. Pt is caox4, in no acute distress and ambulatory to triage. LMP 7/16 so she is due for her period,

## 2024-07-01 ENCOUNTER — Other Ambulatory Visit: Payer: Self-pay

## 2024-07-01 ENCOUNTER — Emergency Department
Admission: EM | Admit: 2024-07-01 | Discharge: 2024-07-01 | Disposition: A | Discharge status: Home / Self Care | Attending: Emergency Medicine | Admitting: Emergency Medicine

## 2024-07-01 DIAGNOSIS — O99891 Other specified diseases and conditions complicating pregnancy: Secondary | ICD-10-CM | POA: Diagnosis present

## 2024-07-01 DIAGNOSIS — H8111 Benign paroxysmal vertigo, right ear: Secondary | ICD-10-CM | POA: Insufficient documentation

## 2024-07-01 DIAGNOSIS — Z3492 Encounter for supervision of normal pregnancy, unspecified, second trimester: Secondary | ICD-10-CM

## 2024-07-01 LAB — CBC
HCT: 35.8 % — ABNORMAL LOW (ref 36.0–46.0)
Hemoglobin: 12.1 g/dL (ref 12.0–15.0)
MCH: 33.1 pg (ref 26.0–34.0)
MCHC: 33.8 g/dL (ref 30.0–36.0)
MCV: 97.8 fL (ref 80.0–100.0)
Platelets: 219 K/uL (ref 150–400)
RBC: 3.66 MIL/uL — ABNORMAL LOW (ref 3.87–5.11)
RDW: 12.8 % (ref 11.5–15.5)
WBC: 8.4 K/uL (ref 4.0–10.5)
nRBC: 0 % (ref 0.0–0.2)

## 2024-07-01 LAB — COMPREHENSIVE METABOLIC PANEL WITH GFR
ALT: 16 U/L (ref 0–44)
AST: 21 U/L (ref 15–41)
Albumin: 3.3 g/dL — ABNORMAL LOW (ref 3.5–5.0)
Alkaline Phosphatase: 55 U/L (ref 38–126)
Anion gap: 13 (ref 5–15)
BUN: 6 mg/dL (ref 6–20)
CO2: 23 mmol/L (ref 22–32)
Calcium: 8.9 mg/dL (ref 8.9–10.3)
Chloride: 102 mmol/L (ref 98–111)
Creatinine, Ser: 0.67 mg/dL (ref 0.44–1.00)
GFR, Estimated: >60 mL/min (ref >60)
Glucose, Bld: 106 mg/dL — ABNORMAL HIGH (ref 70–99)
Potassium: 4 mmol/L (ref 3.5–5.1)
Sodium: 138 mmol/L (ref 135–145)
Total Bilirubin: 0.7 mg/dL (ref 0.0–1.2)
Total Protein: 6.6 g/dL (ref 6.5–8.1)

## 2024-07-01 MED ORDER — MECLIZINE HCL 25 MG PO TABS
25.0000 mg | ORAL_TABLET | Freq: Once | ORAL | Status: AC
  Administered 2024-07-01: 25 mg via ORAL
  Filled 2024-07-01: qty 1

## 2024-07-01 MED ORDER — MECLIZINE HCL 25 MG PO TABS: 25.0000 mg | ORAL_TABLET | Freq: Three times a day (TID) | ORAL | 1 refills | Status: AC | PRN

## 2024-07-01 NOTE — ED Triage Notes (Incomplete)
 Pt to ED via POV from work. Pt reports was at work and started to feeling dizzy and weak. PT reports right sided HA as well. Pt is G5P4. Pt states with previous boy pregnancy similar symptoms started and had seizures.

## 2024-07-01 NOTE — ED Notes (Signed)
 See triage note  Presents with some dizziness  States this started while at work  Then states when she turns her head  she sees stars  Denies any n/v/d or fever Also has a slight headache on the right

## 2024-07-01 NOTE — ED Provider Notes (Signed)
 [facility] Provider Note    Event Date/Time   First MD Initiated Contact with Patient 07/01/24 1317     (approximate)   History   Chief Complaint: Dizziness   HPI  Melanie York is a 33 y.o. female who is currently [redacted] weeks pregnant who comes in complaining of dizziness.  She reports that throughout the morning she has had intermittent episodes of room spinning/motion.  Has some associated nausea but no vomiting.  No vision change, no lightheadedness or falls, no recent head injury.  No fevers or chills or neck pain.  Complains of mild right frontal headache.  No other pain complaints.  Denies any pregnancy related complaints.  No vaginal bleeding leakage of fluid or contractions.  No dysuria frequency urgency.       History reviewed. No pertinent past medical history.  Current Outpatient Rx   Order #: [id number] Class: Normal   Order #: [id number] Class: Historical Med    Past Surgical History:  Procedure Laterality Date   LEEP N/A 10/01/2020   Procedure: LOOP ELECTROSURGICAL EXCISION PROCEDURE (LEEP);  Surgeon: Connell Davies, MD;  Location: [facility] ORS;  Service: Gynecology;  Laterality: N/A;   TONSILLECTOMY     TONSILLECTOMY      Physical Exam   Triage Vital Signs: ED Triage Vitals  Encounter Vitals Group     BP 07/01/24 1252 124/77     Girls Systolic BP Percentile --      Girls Diastolic BP Percentile --      Boys Systolic BP Percentile --      Boys Diastolic BP Percentile --      Pulse Rate 07/01/24 1252 97     Resp 07/01/24 1252 18     Temp 07/01/24 1252 97.8 F (36.6 C)     Temp Source 07/01/24 1252 Oral     SpO2 07/01/24 1252 99 %     Weight 07/01/24 1320 130 lb 1.1 oz (59 kg)     Height 07/01/24 1320 5' 4 (1.626 m)     Head Circumference --      Peak Flow --      Pain Score 07/01/24 1252 2     Pain Loc --      Pain Education --      Exclude from Growth Chart --     Most recent vital signs: Vitals:   07/01/24 1252  BP:  124/77  Pulse: 97  Resp: 18  Temp: 97.8 F (36.6 C)  SpO2: 99%    General: Awake, no distress.  CV:  Good peripheral perfusion.  Resp:  Normal effort.  Abd:  No distention.  Other:  Cranial nerves III through XII intact.  Dix-Hallpike to the right positive.  External canals and TMs normal bilaterally.   ED Results / Procedures / Treatments   Labs (all labs ordered are listed, but only abnormal results are displayed) Labs Reviewed  COMPREHENSIVE METABOLIC PANEL WITH GFR - Abnormal; Notable for the following components:      Result Value   Glucose, Bld 106 (*)    Albumin 3.3 (*)    All other components within normal limits  CBC - Abnormal; Notable for the following components:   RBC 3.66 (*)    HCT 35.8 (*)    All other components within normal limits  CBG MONITORING, ED     EKG Interpreted by me Sinus rhythm rate of 84.  Normal axis intervals QRS ST segments T waves   RADIOLOGY  PROCEDURES:  Procedures   MEDICATIONS ORDERED IN ED: Medications  meclizine (ANTIVERT) tablet 25 mg (has no administration in time range)     IMPRESSION / MDM / ASSESSMENT AND PLAN / ED COURSE  I reviewed the triage vital signs and the nursing notes.  DDx: Electrolyte derangement, AKI, anemia, peripheral vertigo  Patient's presentation is most consistent with acute presentation with potential threat to life or bodily function.  Patient presents with dizziness consistent with peripheral vertigo.  No sign of any acute illness or injury.  No trauma.  Normal vitals.  Normal neuroexam.  Doubt meningitis, sinus thrombosis, stroke.  Recommend meclizine, continue prenatal care.       FINAL CLINICAL IMPRESSION(S) / ED DIAGNOSES   Final diagnoses:  Benign paroxysmal positional vertigo of right ear  Second trimester pregnancy     Rx / DC Orders   ED Discharge Orders          Ordered    meclizine (ANTIVERT) 25 MG tablet  3 times daily PRN        07/01/24 1357              Note:  This document was prepared using Dragon voice recognition software and may include unintentional dictation errors.   Viviann Pastor, MD 07/01/24 [phone]

## 2024-07-01 NOTE — Discharge Instructions (Signed)
 Vertigo symptoms unfortunately can be a recurrent issue over time.  Search for [facility] on modified Epley maneuver for techniques that can help resolve these symptoms at home.
